# Patient Record
Sex: Female | Born: 1985 | Race: Black or African American | Hispanic: No | Marital: Single | Smoking: Never smoker
Health system: Southern US, Community
[De-identification: ages and names within clinical notes are randomized; demographics above are authoritative.]

## PROBLEM LIST (undated history)

## (undated) HISTORY — PX: TUBAL LIGATION: SHX77

## (undated) HISTORY — PX: DILATION AND CURETTAGE OF UTERUS: SHX78

---

## 2005-08-20 ENCOUNTER — Emergency Department (HOSPITAL_COMMUNITY): Admission: EM | Admit: 2005-08-20 | Discharge: 2005-08-20 | Payer: Self-pay | Admitting: *Deleted

## 2008-04-05 ENCOUNTER — Emergency Department (HOSPITAL_COMMUNITY): Admission: EM | Admit: 2008-04-05 | Discharge: 2008-04-06 | Payer: Self-pay | Admitting: Emergency Medicine

## 2010-02-20 ENCOUNTER — Emergency Department (HOSPITAL_BASED_OUTPATIENT_CLINIC_OR_DEPARTMENT_OTHER): Admission: EM | Admit: 2010-02-20 | Discharge: 2010-02-20 | Payer: Self-pay | Admitting: Emergency Medicine

## 2010-02-20 ENCOUNTER — Ambulatory Visit: Payer: Self-pay | Admitting: Diagnostic Radiology

## 2010-10-27 ENCOUNTER — Emergency Department (HOSPITAL_BASED_OUTPATIENT_CLINIC_OR_DEPARTMENT_OTHER)
Admission: EM | Admit: 2010-10-27 | Discharge: 2010-10-27 | Payer: Self-pay | Source: Home / Self Care | Admitting: Emergency Medicine

## 2011-01-08 ENCOUNTER — Encounter (HOSPITAL_COMMUNITY)
Admission: RE | Admit: 2011-01-08 | Discharge: 2011-01-08 | Disposition: A | Discharge status: Home / Self Care | Payer: Medicaid Other | Source: Ambulatory Visit | Attending: Obstetrics and Gynecology | Admitting: Obstetrics and Gynecology

## 2011-01-08 DIAGNOSIS — Z01818 Encounter for other preprocedural examination: Secondary | ICD-10-CM | POA: Insufficient documentation

## 2011-01-08 DIAGNOSIS — Z01812 Encounter for preprocedural laboratory examination: Secondary | ICD-10-CM | POA: Insufficient documentation

## 2011-01-08 LAB — CBC
Hemoglobin: 9.6 g/dL — ABNORMAL LOW (ref 12.0–15.0)
MCV: 91.2 fL (ref 78.0–100.0)
WBC: 6.4 10*3/uL (ref 4.0–10.5)

## 2011-01-08 LAB — RPR: RPR Ser Ql: NONREACTIVE

## 2011-01-15 ENCOUNTER — Other Ambulatory Visit: Payer: Self-pay | Admitting: Obstetrics and Gynecology

## 2011-01-15 ENCOUNTER — Inpatient Hospital Stay (HOSPITAL_COMMUNITY)
Admission: RE | Admit: 2011-01-15 | Discharge: 2011-01-17 | DRG: 766 | Disposition: A | Discharge status: Home / Self Care | Payer: Medicaid Other | Source: Ambulatory Visit | Attending: Obstetrics and Gynecology | Admitting: Obstetrics and Gynecology

## 2011-01-15 DIAGNOSIS — Z302 Encounter for sterilization: Secondary | ICD-10-CM

## 2011-01-15 DIAGNOSIS — O34219 Maternal care for unspecified type scar from previous cesarean delivery: Principal | ICD-10-CM | POA: Diagnosis present

## 2011-01-16 LAB — CBC
HCT: 25 % — ABNORMAL LOW (ref 36.0–46.0)
MCH: 29.7 pg (ref 26.0–34.0)
MCV: 90.6 fL (ref 78.0–100.0)
WBC: 11.1 10*3/uL — ABNORMAL HIGH (ref 4.0–10.5)

## 2011-01-18 NOTE — Op Note (Signed)
NAMETEIGAN, MANNER           ACCOUNT NO.:  [id number]  MEDICAL RECORD NO.:  [id number]           PATIENT TYPE:  I  LOCATION:  9145                          FACILITY:  [facility]  PHYSICIAN:  Malva Limes, M.D.    DATE OF BIRTH:  05/01/1986  DATE OF PROCEDURE:  01/15/2011 DATE OF DISCHARGE:                              OPERATIVE REPORT   PREOPERATIVE DIAGNOSES: 1. Intrauterine pregnancy at term. 2. History of prior cesarean section. 3. The patient declines attempted vaginal birth after cesarean     section. 4. The patient desires repeat cesarean section and bilateral tubal     ligation.  POSTOPERATIVE DIAGNOSES: 1. Intrauterine pregnancy at term. 2. History of prior cesarean section. 3. The patient declines attempted vaginal birth after cesarean     section. 4. The patient desires repeat cesarean section and bilateral tubal     ligation.  PROCEDURES: 1. Repeat low transverse cesarean section. 2. Bilateral tubal ligation, Pomeroy procedure.  SURGEON:  Malva Limes, MD  ASSISTANT:  Ilda Mori, MD  ANESTHESIA:  Spinal.  ANTIBIOTICS:  Ancef 1 g.  DRAINS:  Foley bedside drainage.  ESTIMATED BLOOD LOSS:  900 mL.  SPECIMENS:  Portion of right and left fallopian tube sent to pathology.  COMPLICATIONS:  None.  INDICATIONS:  Ms. Shehata is a 25 year old black female, G4, P 1-0-2- 1 at term who expressed her desire for repeat cesarean section and bilateral tubal ligation.  The patient's prenatal care was uncomplicated.  The patient was advised against tubal ligation given her young age.  However, she strongly desired to have this procedure.  PROCEDURE:  The patient was taken to the operating room where she was placed in the dorsal supine position after spinal anesthetic was administered.  Once an adequate level was reached, she was prepped and draped in usual fashion for this procedure.  A Foley catheter was placed in her bladder.  A Pfannenstiel incision was  made through the previous scar on entering the abdominal cavity.  The bladder flap was taken down with sharp dissection.  A low-transverse uterine incision was made in the midline and extended laterally with blunt dissection.  The infant was delivered in a vertex presentation with the vacuum extractor.  On delivery of the head, the oropharynx and nostrils were bulb suctioned. The remaining infant was then delivered.  The cord was doubly clamped and cut and the infant handed to the awaiting NICU team.  Placenta was manually removed.  The uterus was exteriorized.  Uterine cavity was wiped with a wet lap and examined.  The uterine incision was closed in a single layer of 0 Monocryl suture in a running locking fashion.  The right fallopian tube was then grasped in the isthmic portion with a Babcock.  A 2-3 cm knuckle was doubly ligated with plain gut suture. The knuckle was excised.  Both ostia were visualized.  A similar procedure was performed on the opposite side.  The uterus was then placed back in the abdominal cavity.  Hemostasis was checked and felt to be adequate.  The tubal ligation sites were hemostatic.  The patient had her parietal peritoneum and rectus muscles reapproximated  in the midline using 2-0 Monocryl in a running fashion.  The fascia was closed using 0 Monocryl suture in a running fashion.  The subcuticular tissue was made hemostatic with Bovie.  Stainless steel clips were used to close the skin.  The patient delivered one live viable black female infant weighing 7 pounds 8 ounces.  Apgars were 8 at 1 minute and 9 at 5 minutes.          ______________________________ Malva Limes, M.D.     MA/MEDQ  D:  01/15/2011  T:  01/16/2011  Job:  [id number]  Electronically Signed by Malva Limes M.D. on 01/17/2011 09:47:42 AM

## 2011-01-30 LAB — URINALYSIS, ROUTINE W REFLEX MICROSCOPIC
Bilirubin Urine: NEGATIVE
Glucose, UA: NEGATIVE mg/dL
Ketones, ur: NEGATIVE mg/dL
Nitrite: POSITIVE — AB
Protein, ur: NEGATIVE mg/dL
Urobilinogen, UA: 1 mg/dL (ref 0.0–1.0)

## 2011-01-30 LAB — URINE CULTURE: Colony Count: >=100000

## 2011-01-30 LAB — WET PREP, GENITAL: Trich, Wet Prep: NONE SEEN

## 2011-01-30 LAB — URINE MICROSCOPIC-ADD ON

## 2011-01-30 LAB — GC/CHLAMYDIA PROBE AMP, GENITAL: GC Probe Amp, Genital: NEGATIVE

## 2011-02-01 NOTE — Discharge Summary (Signed)
  NAMESHAQUILLA, Deanna Vasquez           ACCOUNT NO.:  [id number]  MEDICAL RECORD NO.:  [id number]           PATIENT TYPE:  I  LOCATION:  9145                          [facility]ACILITY:  [facility]  PHYSICIAN:  Malva Limes, M.D.    DATE OF BIRTH:  05/25/1986  DATE OF ADMISSION:  01/15/2011 DATE OF DISCHARGE:  01/17/2011                              DISCHARGE SUMMARY   PRINCIPAL DISCHARGE DIAGNOSES: 1. Intrauterine pregnancy at term. 2. History of prior cesarean section. 3. Patient desires repeat cesarean section. 4. Patient desires permanent sterilization.  PRINCIPAL PROCEDURE:  Repeat cesarean section with bilateral tubal ligation.  HISTORY OF PRESENT ILLNESS:  [name] is a 25 year old black female G68, P1-0-2-1 who presented to [facility] on January 15, 2011, for repeat C-section and bilateral tubal ligation.  The patient's prenatal care was uncomplicated.  The patient underwent repeat cesarean section with bilateral tubal ligation on January 15, 2011.  A complete description of this procedure can be found in the dictated operative note.  The patient's postop course was benign.  She was discharged to home on postop day #2.  Her preop hemoglobin was 9.  Her postop hemoglobin was 8.2.  At the time of discharge, her incision appeared to be healing well.  She was eating a regular diet and had adequate pain control and she was discharged to home on iron and Percocet.          ______________________________ Malva Limes, M.D.     MA/MEDQ  D:  01/17/2011  T:  01/17/2011  Job:  [id number]  Electronically Signed by Malva Limes M.D. on 02/01/2011 05:31:00 PM

## 2011-04-07 ENCOUNTER — Emergency Department (HOSPITAL_COMMUNITY)
Admission: EM | Admit: 2011-04-07 | Discharge: 2011-04-07 | Disposition: A | Discharge status: Home / Self Care | Payer: Medicaid Other | Attending: Emergency Medicine | Admitting: Emergency Medicine

## 2011-04-07 DIAGNOSIS — A499 Bacterial infection, unspecified: Secondary | ICD-10-CM | POA: Insufficient documentation

## 2011-04-07 DIAGNOSIS — D6489 Other specified anemias: Secondary | ICD-10-CM | POA: Insufficient documentation

## 2011-04-07 DIAGNOSIS — N760 Acute vaginitis: Secondary | ICD-10-CM | POA: Insufficient documentation

## 2011-04-07 DIAGNOSIS — B9689 Other specified bacterial agents as the cause of diseases classified elsewhere: Secondary | ICD-10-CM | POA: Insufficient documentation

## 2011-04-07 LAB — WET PREP, GENITAL
Trich, Wet Prep: NONE SEEN
Yeast Wet Prep HPF POC: NONE SEEN

## 2011-04-07 LAB — POCT PREGNANCY, URINE: Preg Test, Ur: NEGATIVE

## 2012-04-11 ENCOUNTER — Emergency Department (HOSPITAL_BASED_OUTPATIENT_CLINIC_OR_DEPARTMENT_OTHER)
Admission: EM | Admit: 2012-04-11 | Discharge: 2012-04-11 | Disposition: A | Discharge status: Home / Self Care | Payer: No Typology Code available for payment source | Attending: Emergency Medicine | Admitting: Emergency Medicine

## 2012-04-11 ENCOUNTER — Encounter (HOSPITAL_BASED_OUTPATIENT_CLINIC_OR_DEPARTMENT_OTHER): Payer: Self-pay | Admitting: Emergency Medicine

## 2012-04-11 DIAGNOSIS — T148XXA Other injury of unspecified body region, initial encounter: Secondary | ICD-10-CM

## 2012-04-11 DIAGNOSIS — Y93I9 Activity, other involving external motion: Secondary | ICD-10-CM | POA: Insufficient documentation

## 2012-04-11 DIAGNOSIS — Y998 Other external cause status: Secondary | ICD-10-CM | POA: Insufficient documentation

## 2012-04-11 DIAGNOSIS — IMO0002 Reserved for concepts with insufficient information to code with codable children: Secondary | ICD-10-CM | POA: Insufficient documentation

## 2012-04-11 DIAGNOSIS — Y9289 Other specified places as the place of occurrence of the external cause: Secondary | ICD-10-CM | POA: Insufficient documentation

## 2012-04-11 MED ORDER — IBUPROFEN 400 MG PO TABS
400.0000 mg | ORAL_TABLET | Freq: Once | ORAL | Status: AC
  Administered 2012-04-11: 400 mg via ORAL
  Filled 2012-04-11: qty 1

## 2012-04-11 MED ORDER — CYCLOBENZAPRINE HCL 10 MG PO TABS: ORAL_TABLET | ORAL | Status: DC
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
## 2012-04-11 NOTE — ED Provider Notes (Signed)
Medical screening examination/treatment/procedure(s) were performed by non-physician practitioner and as supervising physician I was immediately available for consultation/collaboration.   Wilford Merryfield B. Zakariyah Freimark, MD 04/11/12 1403 
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
## 2013-02-15 ENCOUNTER — Emergency Department (HOSPITAL_BASED_OUTPATIENT_CLINIC_OR_DEPARTMENT_OTHER)
Admission: EM | Admit: 2013-02-15 | Discharge: 2013-02-15 | Disposition: A | Discharge status: Home / Self Care | Payer: Medicaid Other | Attending: Emergency Medicine | Admitting: Emergency Medicine

## 2013-02-15 ENCOUNTER — Emergency Department (HOSPITAL_BASED_OUTPATIENT_CLINIC_OR_DEPARTMENT_OTHER): Payer: Medicaid Other

## 2013-02-15 ENCOUNTER — Encounter (HOSPITAL_BASED_OUTPATIENT_CLINIC_OR_DEPARTMENT_OTHER): Payer: Self-pay | Admitting: *Deleted

## 2013-02-15 DIAGNOSIS — R0789 Other chest pain: Secondary | ICD-10-CM | POA: Insufficient documentation

## 2013-02-15 DIAGNOSIS — A499 Bacterial infection, unspecified: Secondary | ICD-10-CM | POA: Insufficient documentation

## 2013-02-15 DIAGNOSIS — Z3202 Encounter for pregnancy test, result negative: Secondary | ICD-10-CM | POA: Insufficient documentation

## 2013-02-15 DIAGNOSIS — N760 Acute vaginitis: Secondary | ICD-10-CM | POA: Insufficient documentation

## 2013-02-15 DIAGNOSIS — B9689 Other specified bacterial agents as the cause of diseases classified elsewhere: Secondary | ICD-10-CM | POA: Insufficient documentation

## 2013-02-15 DIAGNOSIS — N898 Other specified noninflammatory disorders of vagina: Secondary | ICD-10-CM | POA: Insufficient documentation

## 2013-02-15 LAB — PREGNANCY, URINE: Preg Test, Ur: NEGATIVE

## 2013-02-15 LAB — WET PREP, GENITAL: Trich, Wet Prep: NONE SEEN

## 2013-02-15 MED ORDER — LANSOPRAZOLE 30 MG PO CPDR: 30.0000 mg | DELAYED_RELEASE_CAPSULE | Freq: Every day | ORAL | Status: DC

## 2013-02-15 MED ORDER — METRONIDAZOLE 500 MG PO TABS: 500.0000 mg | ORAL_TABLET | Freq: Two times a day (BID) | ORAL | Status: DC

## 2013-02-15 NOTE — ED Notes (Signed)
Cough for several days pt states she feels like she also has a vaginal bacterial infection

## 2013-02-15 NOTE — ED Provider Notes (Signed)
History     CSN: [id number]  Arrival date & time 02/15/13  0935   First MD Initiated Contact with Patient 02/15/13 [phone]      Chief Complaint  Patient presents with  . Cough    (Consider location/radiation/quality/duration/timing/severity/associated sxs/prior treatment) Patient is a 27 y.o. female presenting with cough. The history is provided by the patient.  Cough Associated symptoms: no chest pain, no headaches, no rash and no shortness of breath    patient had a cough for the last 2 weeks. She states is worse in the morning. She states it goes to the day. She states sometimes feels as if there is something in her throat. No fevers. No chest pain. She states it is a little pressure that goes along with it. No weight loss. She does not smoke. No clear sick contacts. No known heart problems. No difficulty doing her normal physical activity. No weight loss. Patient also states she's had some vaginal discharge. Began a couple days ago. States it feels like when she's had a previous bacterial infections. No dysuria. No pain. She hopes she is not pregnant and has had a bilateral tubal ligation.  History reviewed. No pertinent past medical history.  Past Surgical History  Procedure Laterality Date  . Dilation and curettage of uterus    . Cesarean section      History reviewed. No pertinent family history.  History  Substance Use Topics  . Smoking status: Never Smoker   . Smokeless tobacco: Never Used  . Alcohol Use: Yes     Comment: occasional    OB History   Grav Para Term Preterm Abortions TAB SAB Ect Mult Living                  Review of Systems  Constitutional: Negative for activity change and appetite change.  HENT: Negative for neck stiffness.   Eyes: Negative for pain.  Respiratory: Positive for cough and chest tightness. Negative for shortness of breath.   Cardiovascular: Negative for chest pain and leg swelling.  Gastrointestinal: Negative for nausea, vomiting,  abdominal pain and diarrhea.  Genitourinary: Positive for vaginal discharge. Negative for flank pain, decreased urine volume, vaginal bleeding, genital sores and pelvic pain.  Musculoskeletal: Negative for back pain.  Skin: Negative for rash.  Neurological: Negative for weakness, numbness and headaches.  Psychiatric/Behavioral: Negative for behavioral problems.    Allergies  Latex  Home Medications   Current Outpatient Rx  Name  Route  Sig  Dispense  Refill  . cyclobenzaprine (FLEXERIL) 10 MG tablet      1/2 to one tab po TID for muscle soreness   20 tablet   0   . lansoprazole (PREVACID) 30 MG capsule   Oral   Take 1 capsule (30 mg total) by mouth daily.   14 capsule   0   . metroNIDAZOLE (FLAGYL) 500 MG tablet   Oral   Take 1 tablet (500 mg total) by mouth 2 (two) times daily.   14 tablet   0     BP 112/64  Pulse 60  Temp(Src) 98.2 F (36.8 C) (Oral)  Resp 20  Ht 5\' 7"  (1.702 m)  Wt 104 lb (47.174 kg)  BMI 16.28 kg/m2  SpO2 100%  LMP 02/02/2013  Physical Exam  Nursing note and vitals reviewed. Constitutional: She is oriented to person, place, and time. She appears well-developed and well-nourished.  HENT:  Head: Normocephalic and atraumatic.  Eyes: EOM are normal. Pupils are equal, round, and  reactive to light.  Neck: Normal range of motion. Neck supple.  Cardiovascular: Normal rate, regular rhythm and normal heart sounds.   No murmur heard. Pulmonary/Chest: Effort normal and breath sounds normal. No respiratory distress. She has no wheezes. She has no rales.  Abdominal: Soft. Bowel sounds are normal. She exhibits no distension. There is no tenderness. There is no rebound and no guarding.  Musculoskeletal: Normal range of motion.  Neurological: She is alert and oriented to person, place, and time. No cranial nerve deficit.  Skin: Skin is warm and dry.  Psychiatric: She has a normal mood and affect. Her speech is normal.   copious thin white vaginal  discharge. Mild pain with cervical motion. No adnexal tenderness.  ED Course  Procedures (including critical care time)  Labs Reviewed  WET PREP, GENITAL - Abnormal; Notable for the following:    Clue Cells Wet Prep HPF POC MANY (*)    WBC, Wet Prep HPF POC FEW (*)    All other components within normal limits  GC/CHLAMYDIA PROBE AMP  PREGNANCY, URINE   Dg Chest 2 View  02/15/2013  *RADIOLOGY REPORT*  Clinical Data: Cough for 2 weeks.  Chest pressure and shortness of breath.  CHEST - 2 VIEW  Comparison: None.  Findings: Midline trachea.  Normal heart size and mediastinal contours. No pleural effusion or pneumothorax.  Clear lungs.  IMPRESSION: Normal chest.   Original Report Authenticated By: Jeronimo Greaves, M.D.      1. Bacterial vaginosis       MDM  Patient with chest pain and vaginal discharge. Pain may be related to reflux since occurs mostly in the morning. Xray reassuring. BV on wet prep. Will treat        Juliet Rude. Rubin Payor, MD 02/15/13 1546

## 2013-02-16 LAB — GC/CHLAMYDIA PROBE AMP: GC Probe RNA: NEGATIVE

## 2013-10-11 ENCOUNTER — Encounter (HOSPITAL_COMMUNITY): Payer: Self-pay | Admitting: Emergency Medicine

## 2013-10-11 ENCOUNTER — Emergency Department (HOSPITAL_COMMUNITY)
Admission: EM | Admit: 2013-10-11 | Discharge: 2013-10-11 | Disposition: A | Discharge status: Home / Self Care | Payer: Medicaid Other | Attending: Emergency Medicine | Admitting: Emergency Medicine

## 2013-10-11 DIAGNOSIS — H532 Diplopia: Secondary | ICD-10-CM | POA: Insufficient documentation

## 2013-10-11 DIAGNOSIS — Z3202 Encounter for pregnancy test, result negative: Secondary | ICD-10-CM | POA: Insufficient documentation

## 2013-10-11 DIAGNOSIS — R350 Frequency of micturition: Secondary | ICD-10-CM | POA: Insufficient documentation

## 2013-10-11 DIAGNOSIS — Z91040 Latex allergy status: Secondary | ICD-10-CM | POA: Insufficient documentation

## 2013-10-11 DIAGNOSIS — R3915 Urgency of urination: Secondary | ICD-10-CM | POA: Insufficient documentation

## 2013-10-11 DIAGNOSIS — R42 Dizziness and giddiness: Secondary | ICD-10-CM | POA: Insufficient documentation

## 2013-10-11 LAB — POCT PREGNANCY, URINE: Preg Test, Ur: NEGATIVE

## 2013-10-11 LAB — URINALYSIS, ROUTINE W REFLEX MICROSCOPIC
Bilirubin Urine: NEGATIVE
Glucose, UA: NEGATIVE mg/dL
Ketones, ur: NEGATIVE mg/dL
Nitrite: POSITIVE — AB
Urobilinogen, UA: 1 mg/dL (ref 0.0–1.0)
pH: 6 (ref 5.0–8.0)

## 2013-10-11 LAB — POCT I-STAT, CHEM 8
BUN: 13 mg/dL (ref 6–23)
Calcium, Ion: 1.19 mmol/L (ref 1.12–1.23)
Chloride: 107 mEq/L (ref 96–112)
HCT: 41 % (ref 36.0–46.0)
Sodium: 142 mEq/L (ref 135–145)
TCO2: 23 mmol/L (ref 0–100)

## 2013-10-11 LAB — URINE MICROSCOPIC-ADD ON

## 2013-10-11 MED ORDER — MECLIZINE HCL 50 MG PO TABS: 50.0000 mg | ORAL_TABLET | Freq: Three times a day (TID) | ORAL | Status: DC | PRN

## 2013-10-11 MED ORDER — MECLIZINE HCL 25 MG PO TABS
25.0000 mg | ORAL_TABLET | Freq: Once | ORAL | Status: AC
  Administered 2013-10-11: 25 mg via ORAL
  Filled 2013-10-11: qty 1

## 2013-10-11 MED ORDER — SODIUM CHLORIDE 0.9 % IV BOLUS (SEPSIS)
1000.0000 mL | Freq: Once | INTRAVENOUS | Status: AC
  Administered 2013-10-11: 1000 mL via INTRAVENOUS

## 2013-10-11 NOTE — ED Provider Notes (Signed)
CSN: [id number]     Arrival date & time 10/11/13  1202 History   First MD Initiated Contact with Patient 10/11/13 1358     Chief Complaint  Patient presents with  . Dizziness   (Consider location/radiation/quality/duration/timing/severity/associated sxs/prior Treatment) HPI Patient reports she has been lightheaded and dizzy x 3 days.  Room spinning is occasional, lightheadedness is persistent.  Improved with lying down, worse with moving head.  Today at work she felt her vision change and temporarily had double vision, improved with blinking and rubbing her eyes.  Felt she was going to pass out so she came to the ED.  States she had urinary frequency and urgency last week and thinks she probably had a UTI but was never treated.  Denies fevers, myalgias, CP, palpitations, SOB, URI symptoms, abdominal pain, N/V/D, change in bowel habits, abnormal vaginal discharge or menstrual irregularities.  Denies weakness or numbness of the extremities, difficulty speaking or thinking or walking.    History reviewed. No pertinent past medical history. Past Surgical History  Procedure Laterality Date  . Dilation and curettage of uterus    . Cesarean section    . Tubal ligation     Family History  Problem Relation Age of Onset  . Peptic Ulcer Disease Mother   . Bronchitis Father   . Asthma Brother    History  Substance Use Topics  . Smoking status: Never Smoker   . Smokeless tobacco: Never Used  . Alcohol Use: Yes     Comment: occasional   OB History   Grav Para Term Preterm Abortions TAB SAB Ect Mult Living                 Review of Systems  Constitutional: Negative for fever, activity change and appetite change.  HENT: Negative for congestion, ear pain, sore throat and trouble swallowing.   Respiratory: Negative for cough and shortness of breath.   Cardiovascular: Negative for chest pain.  Gastrointestinal: Negative for nausea, vomiting, abdominal pain and diarrhea.  Genitourinary:  Negative for dysuria, urgency, frequency, vaginal bleeding and vaginal discharge.  Musculoskeletal: Negative for gait problem, neck pain and neck stiffness.  Allergic/Immunologic: Negative for immunocompromised state.  Neurological: Positive for dizziness, light-headedness and headaches (this morning, resolved with motrin). Negative for syncope, speech difficulty, weakness and numbness.  Hematological: Does not bruise/bleed easily.  Psychiatric/Behavioral: Negative for confusion.    Allergies  Latex  Home Medications   Current Outpatient Rx  Name  Route  Sig  Dispense  Refill  . ibuprofen (ADVIL,MOTRIN) 200 MG tablet   Oral   Take 400 mg by mouth every 6 (six) hours as needed (pain).          BP 123/89  Pulse 78  Temp(Src) 98.2 F (36.8 C) (Oral)  Resp 16  SpO2 99%  LMP 10/11/2013 Physical Exam  Nursing note and vitals reviewed. Constitutional: She appears well-developed and well-nourished. No distress.  HENT:  Head: Normocephalic and atraumatic.  Neck: Neck supple.  Cardiovascular: Normal rate and regular rhythm.   Pulmonary/Chest: Effort normal and breath sounds normal. No respiratory distress. She has no wheezes. She has no rales.  Abdominal: Soft. She exhibits no distension. There is no tenderness. There is no rebound and no guarding.  Neurological: She is alert.  CN II-XII intact, EOMs intact, no pronator drift, grip strengths equal bilaterally; strength 5/5 in all extremities, sensation intact in all extremities; finger to nose, heel to shin, rapid alternating movements normal; gait is normal.   Dizziness  elicited with quick movements of the head.   Skin: She is not diaphoretic.    ED Course  Procedures (including critical care time) Labs Review Labs Reviewed  URINALYSIS, ROUTINE W REFLEX MICROSCOPIC  POCT I-STAT, CHEM 8  POCT PREGNANCY, URINE   Imaging Review No results found.  EKG Interpretation   None       MDM   1. Lightheadedness   2.  Dizziness     Patient with dizziness and lightheadedness x 2 days with "double vision" improved with blinking today, doubt true diplopia, neurological exam is normal.  Urinary frequency and urgency last week. Dizziness relieved with meclizine.  IVF given for lightheadedness.  UA pending at change of shift.  Discussed pt with Ebbie Ridge, PA-C, who assumes care of patient pending UA.      Trixie Dredge, PA-C 10/11/13 1630

## 2013-10-11 NOTE — ED Notes (Addendum)
Patient reports waking up with dizziness 2 days ago and today has a headache as well today. Patient denies any N/V or any other symptoms. Patient states she took a Motrin at 0700 today, but the"Motrin made her headache and dizziness worse."

## 2013-10-11 NOTE — Progress Notes (Signed)
Patient confirms she does not have a pcp.  EDCM instructed patient to call th ephone number on the back of her insurance card to help her find a pcp who is close to her and within network.  Patient verbalized understanding.

## 2013-10-13 LAB — URINE CULTURE

## 2013-10-13 NOTE — ED Provider Notes (Signed)
Medical screening examination/treatment/procedure(s) were conducted as a shared visit with non-physician practitioner(s) and myself.  I personally evaluated the patient during the encounter.  EKG Interpretation   None      No neurological deficits. Vital signs stable.  Donnetta Hutching, MD 10/13/13 0005

## 2013-10-14 NOTE — Progress Notes (Signed)
ED Antimicrobial Stewardship Positive Culture Follow Up   Deanna Vasquez is an 27 y.o. female who presented to [facility] on 10/11/2013 with a chief complaint of  Chief Complaint  Patient presents with  . Dizziness    Recent Results (from the past 720 hour(s))  URINE CULTURE     Status: None   Collection Time    10/11/13  3:39 PM      Result Value Range Status   Specimen Description URINE, CLEAN CATCH   Final   Special Requests NONE   Final   Culture  Setup Time     Final   Value: 10/11/2013 21:35     Performed at [facility] Count     Final   Value: >=100,000 COLONIES/ML     Performed at [facility]   Culture     Final   Value: ESCHERICHIA COLI     Performed at [facility]   Report Status 10/13/2013 FINAL   Final   Organism ID, Bacteria ESCHERICHIA COLI   Final     []  Patient discharged originally without antimicrobial agent and treatment is now indicated  New antibiotic prescription: Bactrim DS 1 tablet BID x 3 days  ED Provider: Johnnette Gourd, PA-C   Mickeal Skinner 10/14/2013, 9:54 AM Infectious Diseases Pharmacist Phone# [phone]

## 2013-10-14 NOTE — ED Notes (Signed)
Post ED Visit - Positive Culture Follow-up: Successful Patient Follow-Up  Culture assessed and recommendations reviewed by: []  Wes Dulaney, Pharm.D., BCPS [x]  Celedonio Miyamoto, Pharm.D., BCPS []  Georgina Pillion, Pharm.D., BCPS []  [place], [address].D., BCPS, AAHIVP []  Estella Husk, Pharm.D., BCPS, AAHIVP  [ ]  Patient discharged originally without antimicrobial agent and treatment is now indicated  New antibiotic prescription: Bactrim DS 1 tablet BID x 3 days  ED Provider: Johnnette Gourd, PA-C    Larena Sox 10/14/2013, 10:45 PM

## 2013-10-15 ENCOUNTER — Telehealth (HOSPITAL_COMMUNITY): Payer: Self-pay | Admitting: *Deleted

## 2014-04-27 ENCOUNTER — Encounter (HOSPITAL_BASED_OUTPATIENT_CLINIC_OR_DEPARTMENT_OTHER): Payer: Self-pay | Admitting: Emergency Medicine

## 2014-04-27 ENCOUNTER — Emergency Department (HOSPITAL_BASED_OUTPATIENT_CLINIC_OR_DEPARTMENT_OTHER)
Admission: EM | Admit: 2014-04-27 | Discharge: 2014-04-27 | Disposition: A | Discharge status: Home / Self Care | Payer: Medicaid Other | Attending: Emergency Medicine | Admitting: Emergency Medicine

## 2014-04-27 ENCOUNTER — Emergency Department (HOSPITAL_BASED_OUTPATIENT_CLINIC_OR_DEPARTMENT_OTHER): Payer: Medicaid Other

## 2014-04-27 DIAGNOSIS — Z79899 Other long term (current) drug therapy: Secondary | ICD-10-CM | POA: Insufficient documentation

## 2014-04-27 DIAGNOSIS — M65839 Other synovitis and tenosynovitis, unspecified forearm: Secondary | ICD-10-CM | POA: Insufficient documentation

## 2014-04-27 DIAGNOSIS — Z91040 Latex allergy status: Secondary | ICD-10-CM | POA: Insufficient documentation

## 2014-04-27 DIAGNOSIS — M65849 Other synovitis and tenosynovitis, unspecified hand: Principal | ICD-10-CM

## 2014-04-27 DIAGNOSIS — M779 Enthesopathy, unspecified: Secondary | ICD-10-CM

## 2014-04-27 NOTE — ED Provider Notes (Signed)
CSN: [id number]     Arrival date & time 04/27/14  1319 History   First MD Initiated Contact with Patient 04/27/14 1322     Chief Complaint  Patient presents with  . Hand Pain     (Consider location/radiation/quality/duration/timing/severity/associated sxs/prior Treatment) HPI Comments: Patient presents to the ER for evaluation of right hand pain. Patient reports pain in the palm of her hand that she first noticed when she woke up. Pain is in the area of the pinky finger, over the palm. She has not noticed any skin changes. Pain is constant, throbbing and worsens when she moves the finger and hand.  Patient is a 28 y.o. female presenting with hand pain.  Hand Pain    History reviewed. No pertinent past medical history. Past Surgical History  Procedure Laterality Date  . Dilation and curettage of uterus    . Cesarean section    . Tubal ligation     Family History  Problem Relation Age of Onset  . Peptic Ulcer Disease Mother   . Bronchitis Father   . Asthma Brother    History  Substance Use Topics  . Smoking status: Never Smoker   . Smokeless tobacco: Never Used  . Alcohol Use: Yes     Comment: occasional   OB History   Grav Para Term Preterm Abortions TAB SAB Ect Mult Living                 Review of Systems  Musculoskeletal: Negative for joint swelling.  Skin: Negative for color change, rash and wound.      Allergies  Latex  Home Medications   Prior to Admission medications   Medication Sig Start Date End Date Taking? Authorizing Provider  Multiple Vitamin (MULTIVITAMIN) tablet Take 1 tablet by mouth daily.   Yes Historical Provider, MD  ibuprofen (ADVIL,MOTRIN) 200 MG tablet Take 400 mg by mouth every 6 (six) hours as needed (pain).    Historical Provider, MD  meclizine (ANTIVERT) 50 MG tablet Take 1 tablet (50 mg total) by mouth 3 (three) times daily as needed for dizziness. 10/11/13   Trixie DredgeEmily West, PA-C   BP 109/68  Pulse 83  Temp(Src) 98.3 F (36.8 C)  (Oral)  Resp 18  Ht 5\' 7"  (1.702 m)  Wt 100 lb (45.36 kg)  BMI 15.66 kg/m2  SpO2 100%  LMP 04/08/2014 Physical Exam  Musculoskeletal:       Right hand: She exhibits tenderness (soft tissue, hypothenar eminence). She exhibits normal range of motion, no bony tenderness, normal capillary refill, no deformity, no laceration and no swelling.       Hands: Neurological: She is alert. She has normal strength. No cranial nerve deficit or sensory deficit. She exhibits normal muscle tone.  Skin: Skin is warm, dry and intact.    ED Course  Procedures (including critical care time) Labs Review Labs Reviewed - No data to display  Imaging Review Dg Hand Complete Right  04/27/2014   CLINICAL DATA:  Hand pain  EXAM: RIGHT HAND - COMPLETE 3+ VIEW  COMPARISON:  None.  FINDINGS: There is no evidence of fracture or dislocation. There is no evidence of arthropathy or other focal bone abnormality. Soft tissues are unremarkable.  IMPRESSION: Negative.   Electronically Signed   By: Marlan Palauharles  Clark M.D.   On: 04/27/2014 14:11     EKG Interpretation None      MDM   Final diagnoses:  None    Presents with nontraumatic pain over the hyperthenar eminence  region of the palm of the right hand. No overlying skin changes - no redness, induration, warmth. Nothing to suggest infection. Patient has normal sensation over the lesion and distally in the fingers. She has normal capillary refill. Normal neurovascular function of the hand is identified.  Palpation of the soft tissues on the ulnar aspect of the palm elicits tenderness. Palpation over the metacarpal bones does not. Pain appears to be soft tissue in nature, likely muscular and inflammatory.    Gilda Creasehristopher J. Andrika Peraza, MD 04/27/14 1438

## 2014-04-27 NOTE — Discharge Instructions (Signed)
Repetitive Strain Injuries  Repetitive strain injuries (RSIs) result from overuse or misuse of soft tissues including muscles, tendons, or nerves. Tendons are the cord-like structures that attach muscles to bones. RSIs can affect almost any part of the body. However, RSIs are most common in the arms (thumbs, wrists, elbows, shoulders) and legs (ankles, knees). Common medical conditions that are often caused by repetitive strain include carpal tunnel syndrome, tennis or golfer's elbow, bursitis, and tendonitis. If RSIs are treated early, and therepeated activity is reduced or removed, the severity and length of your problems can usually be reduced. RSIs are also called cumulative trauma disorders (CTD).   CAUSES   Many RSIs occur due to repeating the same activity at work over weeks or months without sufficient rest, such as prolonged typing. RSIs also commonly occur when a hobby or sport is done repeatedly without sufficient rest. RSIs can also occur due to repeated strain or stress on a body part in someone who has one or more risk factors for RSIs.  RISK FACTORS  Workplace risk factors   Frequent computer use, especially if your workstation is not adjusted for your body type.   Infrequent rest breaks.   Working in a high-pressure environment.   Working at a fast pace.   Repeating the same motion, such as frequent typing.   Working in an awkward position or holding the same position for a long time.   Forceful movements such as lifting, pulling, or pushing.   Vibration caused by using power tools.   Working in cold temperatures.   Job stress.  Personal risk factors   Poor posture.   Being loose-jointed.   Not exercising regularly.   Being overweight.   Arthritis, diabetes, thyroid problems, or other long-term (chronic)medical conditions.   Vitamin deficiencies.   Keeping your fingernails long.   An unhealthy, stressful, or inactive lifestyle.   Not sleeping well.  SYMPTOMS   Symptoms often  begin at work but become more noticeable after the repeated stress has ended. For example, you may develop fatigue or soreness in your wrist while typingat work, and at night you may develop numbness and tingling in your fingers. Common symptoms include:    Burning, shooting, or aching pain, especially in the fingers, palms, wrists, forearms, or shoulders.   Tenderness.   Swelling.   Tingling, numbness, or loss of feeling.   Pain with certain activities, such as turning a doorknob or reaching above your head.   Weakness, heaviness, or loss of coordination in yourhand.   Muscle spasms or tightness.  In some cases, symptoms can become so intense that it is difficult to perform everyday tasks. Symptoms that do not improve with rest may indicate a more serious condition.   DIAGNOSIS   Your caregiver may determine the type ofRSI you have based on your medical evaluation and a description of your activities.   TREATMENT   Treatment depends on the severity and type of RSI you have. Your caregiver may recommend rest for the affected body part, medicines, and physical or occupational therapy to reduce pain, swelling, and soreness. Discuss the activities you do repeatedly with your caregiver. Your caregiver can help you decide whether you need to change your activities. An RSI may take months or years to heal, especially if the affected body part gets insufficient rest. In some cases, such as severe carpal tunnel syndrome, surgery may be recommended.  PREVENTION   Talk with your supervisor to make sure you have the proper equipment   cushion in the curve of your lower back.  Shoulders and arms relaxed and at your sides.  Neck relaxed and not bent forwards or backwards.  Your desk and computer workstation  properly adjusted to your body type.  Your chair adjusted so there is no excess pressure on the back of your thighs.  The keyboard resting above your thighs. You should be able to reach the keys with your elbows at your side, bent at a right angle. Your arms should be supported on forearm rests, with your forearms parallel to the ground.  The computer mouse within easy reach.  The monitor directly in front of you, so that your eyes are aligned with the top of the screen. The screen should be about 15 to 25 inches from your eyes.  While typing, keep your wrist straight, in a neutral position. Move your entire arm when you move your mouse or when typing hard-to-reach keys.  Only use your computer as much as you need to for work. Do not use it during breaks.  Take breaks often from any repeated activity. Alternate with another task which requires you to use different muscles, or rest at least once every hour.  Change positions regularly. If you spend a lot of time sitting, get up, walk around, and stretch.  Do not hold pens or pencils tightly when writing.  Exercise regularly.  Maintain a normal weight.  Eat a diet with plenty of vegetables, whole grains, and fruit.  Get sufficient, restful sleep. HOME CARE INSTRUCTIONS  If your caregiver prescribed medicine to help reduce swelling, take it as directed.  Only take over-the-counter or prescription medicines for pain, discomfort, or fever as directed by your caregiver.  Reduce, and if needed, stopthe activities that are causing your problems until you have no further symptoms.If your symptoms are work-related, you may need to talk to your supervisor about changing your activities.  When symptoms develop, put ice or a cold pack on the aching area.  Put ice in a plastic bag.  Place a towel between your skin and the bag.  Leave the ice on for 15-20 minutes.  If you were given a splint to keep your wrist from bending, wear it as  instructed. It is important to wear the splint at night. Use the splint for as long as your caregiver recommends. SEEK MEDICAL CARE IF:  You develop new problems.  Your problems do not get better with medicine. MAKE SURE YOU:  Understand these instructions.  Will watch your condition.  Will get help right away if you are not doing well or get worse. Document Released: 10/18/2002 Document Revised: 04/28/2012 Document Reviewed: 12/19/2011 [facility] Patient Information 2015 [place], [place]. This information is not intended to replace advice given to you by your health care provider. Make sure you discuss any questions you have with your health care provider.  Tendinitis Tendinitis is swelling and inflammation of the tendons. Tendons are band-like tissues that connect muscle to bone. Tendinitis commonly occurs in the:   Shoulders (rotator cuff).  Heels (Achilles tendon).  Elbows (triceps tendon). CAUSES Tendinitis is usually caused by overusing the tendon, muscles, and joints involved. When the tissue surrounding a tendon (synovium) becomes inflamed, it is called tenosynovitis. Tendinitis commonly develops in people whose jobs require repetitive motions. SYMPTOMS  Pain.  Tenderness.  Mild swelling. DIAGNOSIS Tendinitis is usually diagnosed by physical exam. Your health care provider may also order X-rays or other imaging tests. TREATMENT Your health care provider may recommend certain  medicines or exercises for your treatment. HOME CARE INSTRUCTIONS   Use a sling or splint for as long as directed by your health care provider until the pain decreases.  Put ice on the injured area.  Put ice in a plastic bag.  Place a towel between your skin and the bag.  Leave the ice on for 15-20 minutes, 3-4 times a day, or as directed by your health care provider.  Avoid using the limb while the tendon is painful. Perform gentle range of motion exercises only as directed by your health  care provider. Stop exercises if pain or discomfort increase, unless directed otherwise by your health care provider.  Only take over-the-counter or prescription medicines for pain, discomfort, or fever as directed by your health care provider. SEEK MEDICAL CARE IF:   Your pain and swelling increase.  You develop new, unexplained symptoms, especially increased numbness in the hands. MAKE SURE YOU:   Understand these instructions.  Will watch your condition.  Will get help right away if you are not doing well or get worse. Document Released: 10/25/2000 Document Revised: 11/02/2013 Document Reviewed: 01/14/2011 [facility] Patient Information 2015 [place]. This information is not intended to replace advice given to you by your health care provider. Make sure you discuss any questions you have with your health care provider.

## 2014-04-27 NOTE — ED Notes (Signed)
Pt c/o right hand pain at base of 5th finger, non radiating. Pain worse when "picking up something". Denies injury

## 2014-11-05 ENCOUNTER — Emergency Department (HOSPITAL_BASED_OUTPATIENT_CLINIC_OR_DEPARTMENT_OTHER)
Admission: EM | Admit: 2014-11-05 | Discharge: 2014-11-05 | Disposition: A | Discharge status: Home / Self Care | Payer: No Typology Code available for payment source | Attending: Emergency Medicine | Admitting: Emergency Medicine

## 2014-11-05 ENCOUNTER — Encounter (HOSPITAL_BASED_OUTPATIENT_CLINIC_OR_DEPARTMENT_OTHER): Payer: Self-pay | Admitting: *Deleted

## 2014-11-05 DIAGNOSIS — Z91040 Latex allergy status: Secondary | ICD-10-CM | POA: Insufficient documentation

## 2014-11-05 DIAGNOSIS — Z79899 Other long term (current) drug therapy: Secondary | ICD-10-CM | POA: Insufficient documentation

## 2014-11-05 DIAGNOSIS — B9689 Other specified bacterial agents as the cause of diseases classified elsewhere: Secondary | ICD-10-CM

## 2014-11-05 DIAGNOSIS — Z3202 Encounter for pregnancy test, result negative: Secondary | ICD-10-CM | POA: Insufficient documentation

## 2014-11-05 DIAGNOSIS — N390 Urinary tract infection, site not specified: Secondary | ICD-10-CM | POA: Insufficient documentation

## 2014-11-05 DIAGNOSIS — N760 Acute vaginitis: Secondary | ICD-10-CM | POA: Insufficient documentation

## 2014-11-05 LAB — URINALYSIS, ROUTINE W REFLEX MICROSCOPIC
Bilirubin Urine: NEGATIVE
Glucose, UA: NEGATIVE mg/dL
HGB URINE DIPSTICK: NEGATIVE
Ketones, ur: NEGATIVE mg/dL
Nitrite: POSITIVE — AB
Protein, ur: NEGATIVE mg/dL
SPECIFIC GRAVITY, URINE: 1.016 (ref 1.005–1.030)
Urobilinogen, UA: 0.2 mg/dL (ref 0.0–1.0)
pH: 7.5 (ref 5.0–8.0)

## 2014-11-05 LAB — PREGNANCY, URINE: PREG TEST UR: NEGATIVE

## 2014-11-05 LAB — WET PREP, GENITAL
Trich, Wet Prep: NONE SEEN
YEAST WET PREP: NONE SEEN

## 2014-11-05 LAB — URINE MICROSCOPIC-ADD ON

## 2014-11-05 MED ORDER — CEPHALEXIN 500 MG PO CAPS: 500.0000 mg | ORAL_CAPSULE | Freq: Four times a day (QID) | ORAL | Status: DC

## 2014-11-05 MED ORDER — METRONIDAZOLE 500 MG PO TABS
500.0000 mg | ORAL_TABLET | Freq: Two times a day (BID) | ORAL | Status: DC
Start: 2014-11-05 — End: 2015-06-23

## 2014-11-05 NOTE — ED Notes (Signed)
Pt c/o vaginal discharge and odor x 1 week

## 2014-11-05 NOTE — Discharge Instructions (Signed)
Take the prescribed medication as directed.  Do not drink alcohol while taking Flagyl, it will make you sick. Return to the ED for new or worsening symptoms.

## 2014-11-05 NOTE — ED Provider Notes (Signed)
CSN: [id number]     Arrival date & time 11/05/14  1217 History   First MD Initiated Contact with Patient 11/05/14 1256     Chief Complaint  Patient presents with  . Vaginal Discharge     (Consider location/radiation/quality/duration/timing/severity/associated sxs/prior Treatment) Patient is a 28 y.o. female presenting with vaginal discharge. The history is provided by the patient and medical records.  Vaginal Discharge   This is a 28 year old female presenting to the ED complaining of vaginal discharge for the past week. States vaginal discharge is thick and white in appearance with a foul odor. States similar symptoms in the past when she was diagnosed with bacterial vaginosis. She denies any new sexual partners or concern for STD. No urinary symptoms. No abdominal pain or pelvic pain. No nausea, vomiting, or diarrhea. Patient is status post tubal ligation.  History reviewed. No pertinent past medical history. Past Surgical History  Procedure Laterality Date  . Dilation and curettage of uterus    . Cesarean section    . Tubal ligation     Family History  Problem Relation Age of Onset  . Peptic Ulcer Disease Mother   . Bronchitis Father   . Asthma Brother    History  Substance Use Topics  . Smoking status: Never Smoker   . Smokeless tobacco: Never Used  . Alcohol Use: Yes     Comment: occasional   OB History    No data available     Review of Systems  Genitourinary: Positive for vaginal discharge.  All other systems reviewed and are negative.     Allergies  Latex  Home Medications   Prior to Admission medications   Medication Sig Start Date End Date Taking? Authorizing Provider  ibuprofen (ADVIL,MOTRIN) 200 MG tablet Take 400 mg by mouth every 6 (six) hours as needed (pain).    Historical Provider, MD  meclizine (ANTIVERT) 50 MG tablet Take 1 tablet (50 mg total) by mouth 3 (three) times daily as needed for dizziness. 10/11/13   Trixie DredgeEmily West, PA-C  Multiple  Vitamin (MULTIVITAMIN) tablet Take 1 tablet by mouth daily.    Historical Provider, MD   BP 112/58 mmHg  Pulse 83  Temp(Src) 98.4 F (36.9 C) (Oral)  Resp 16  Ht 5\' 6"  (1.676 m)  Wt 100 lb (45.36 kg)  BMI 16.15 kg/m2  SpO2 100%  LMP 10/13/2014   Physical Exam  Constitutional: She is oriented to person, place, and time. She appears well-developed and well-nourished. No distress.  HENT:  Head: Normocephalic and atraumatic.  Mouth/Throat: Oropharynx is clear and moist.  Eyes: Conjunctivae and EOM are normal. Pupils are equal, round, and reactive to light.  Neck: Normal range of motion. Neck supple.  Cardiovascular: Normal rate, regular rhythm and normal heart sounds.   Pulmonary/Chest: Effort normal and breath sounds normal. No respiratory distress. She has no wheezes.  Abdominal: Soft. Bowel sounds are normal. There is no tenderness. There is no guarding.  Genitourinary: There is no lesion on the right labia. There is no lesion on the left labia. Cervix exhibits no motion tenderness. Right adnexum displays no tenderness. Left adnexum displays no tenderness. No erythema, tenderness or bleeding in the vagina. No foreign body around the vagina. Vaginal discharge found.  Normal female external genitalia without visible lesions; moderate amount of foul-smelling white vaginal discharge present in vault, cervix normal in appearance; no adnexal or CMT  Musculoskeletal: Normal range of motion.  Neurological: She is alert and oriented to person, place, and time.  Skin: Skin is warm and dry. She is not diaphoretic.  Psychiatric: She has a normal mood and affect.  Nursing note and vitals reviewed.   ED Course  Procedures (including critical care time) Labs Review Labs Reviewed  WET PREP, GENITAL - Abnormal; Notable for the following:    Clue Cells Wet Prep HPF POC MODERATE (*)    WBC, Wet Prep HPF POC FEW (*)    All other components within normal limits  URINALYSIS, ROUTINE W REFLEX  MICROSCOPIC - Abnormal; Notable for the following:    APPearance TURBID (*)    Nitrite POSITIVE (*)    Leukocytes, UA SMALL (*)    All other components within normal limits  URINE MICROSCOPIC-ADD ON - Abnormal; Notable for the following:    Squamous Epithelial / LPF MANY (*)    Bacteria, UA MANY (*)    All other components within normal limits  GC/CHLAMYDIA PROBE AMP  PREGNANCY, URINE    Imaging Review No results found.   EKG Interpretation None      MDM   Final diagnoses:  UTI (lower urinary tract infection)  Bacterial vaginosis   28 year old female with vaginal discharge. Abdominal exam is benign. No adnexal or cervical motion tenderness on exam to suggest PID. Wet prep confirms bacterial vaginosis. Patient also has a nitrite positive urinary tract infection. She was started on Keflex and Flagyl.  FU with PCP.  Discussed plan with patient, he/she acknowledged understanding and agreed with plan of care.  Return precautions given for new or worsening symptoms.  Garlon HatchetLisa M Noreen Mackintosh, PA-C 11/05/14 1510  Joya Gaskinsonald W Wickline, MD 11/05/14 [phone]

## 2014-11-07 LAB — GC/CHLAMYDIA PROBE AMP
CT Probe RNA: NEGATIVE
GC Probe RNA: NEGATIVE

## 2015-06-22 ENCOUNTER — Emergency Department (HOSPITAL_BASED_OUTPATIENT_CLINIC_OR_DEPARTMENT_OTHER): Payer: Medicaid Other

## 2015-06-22 ENCOUNTER — Encounter (HOSPITAL_BASED_OUTPATIENT_CLINIC_OR_DEPARTMENT_OTHER): Payer: Self-pay | Admitting: *Deleted

## 2015-06-22 DIAGNOSIS — Y9389 Activity, other specified: Secondary | ICD-10-CM | POA: Insufficient documentation

## 2015-06-22 DIAGNOSIS — Y9289 Other specified places as the place of occurrence of the external cause: Secondary | ICD-10-CM | POA: Insufficient documentation

## 2015-06-22 DIAGNOSIS — Y998 Other external cause status: Secondary | ICD-10-CM | POA: Diagnosis not present

## 2015-06-22 DIAGNOSIS — W208XXA Other cause of strike by thrown, projected or falling object, initial encounter: Secondary | ICD-10-CM | POA: Insufficient documentation

## 2015-06-22 DIAGNOSIS — Z91040 Latex allergy status: Secondary | ICD-10-CM | POA: Insufficient documentation

## 2015-06-22 DIAGNOSIS — Z79899 Other long term (current) drug therapy: Secondary | ICD-10-CM | POA: Insufficient documentation

## 2015-06-22 DIAGNOSIS — Z792 Long term (current) use of antibiotics: Secondary | ICD-10-CM | POA: Insufficient documentation

## 2015-06-22 DIAGNOSIS — S99922A Unspecified injury of left foot, initial encounter: Secondary | ICD-10-CM | POA: Diagnosis present

## 2015-06-22 DIAGNOSIS — S97122A Crushing injury of left lesser toe(s), initial encounter: Secondary | ICD-10-CM | POA: Insufficient documentation

## 2015-06-22 NOTE — ED Notes (Signed)
Injury to her left 3rd toe. She dropped a 6 pack of Gatorade on her foot earlier today. Bruising and swelling noted.

## 2015-06-23 ENCOUNTER — Emergency Department (HOSPITAL_BASED_OUTPATIENT_CLINIC_OR_DEPARTMENT_OTHER)
Admission: EM | Admit: 2015-06-23 | Discharge: 2015-06-23 | Disposition: A | Discharge status: Home / Self Care | Payer: Medicaid Other | Attending: Emergency Medicine | Admitting: Emergency Medicine

## 2015-06-23 DIAGNOSIS — S97102A Crushing injury of unspecified left toe(s), initial encounter: Secondary | ICD-10-CM

## 2015-06-23 MED ORDER — HYDROCODONE-ACETAMINOPHEN 5-325 MG PO TABS: 1.0000 | ORAL_TABLET | Freq: Four times a day (QID) | ORAL | Status: AC | PRN

## 2015-06-23 MED ORDER — HYDROCODONE-ACETAMINOPHEN 5-325 MG PO TABS
1.0000 | ORAL_TABLET | Freq: Once | ORAL | Status: AC
  Administered 2015-06-23: 1 via ORAL
  Filled 2015-06-23: qty 1

## 2015-06-23 NOTE — ED Notes (Signed)
Ice applied

## 2015-06-23 NOTE — ED Provider Notes (Signed)
CSN: [id number]     Arrival date & time 06/22/15  2153 History   First MD Initiated Contact with Patient 06/23/15 0151     Chief Complaint  Patient presents with  . Toe Injury     (Consider location/radiation/quality/duration/timing/severity/associated sxs/prior Treatment) HPI  This is a 29 year old female who dropped a sixpack of Gatorade onto her left middle toe about one or 2 PM yesterday afternoon. She is having moderate pain in that toe, worse with ambulation, palpation or attempted movement. Range of motion of that toe is decreased. There is associated ecchymosis but no deformity.  History reviewed. No pertinent past medical history. Past Surgical History  Procedure Laterality Date  . Dilation and curettage of uterus    . Cesarean section    . Tubal ligation     Family History  Problem Relation Age of Onset  . Peptic Ulcer Disease Mother   . Bronchitis Father   . Asthma Brother    Social History  Substance Use Topics  . Smoking status: Never Smoker   . Smokeless tobacco: Never Used  . Alcohol Use: Yes     Comment: occasional   OB History    No data available     Review of Systems  All other systems reviewed and are negative.   Allergies  Latex  Home Medications   Prior to Admission medications   Medication Sig Start Date End Date Taking? Authorizing Provider  cephALEXin (KEFLEX) 500 MG capsule Take 1 capsule (500 mg total) by mouth 4 (four) times daily. 11/05/14   Garlon Hatchet, PA-C  ibuprofen (ADVIL,MOTRIN) 200 MG tablet Take 400 mg by mouth every 6 (six) hours as needed (pain).    Historical Provider, MD  meclizine (ANTIVERT) 50 MG tablet Take 1 tablet (50 mg total) by mouth 3 (three) times daily as needed for dizziness. 10/11/13   Trixie Dredge, PA-C  metroNIDAZOLE (FLAGYL) 500 MG tablet Take 1 tablet (500 mg total) by mouth 2 (two) times daily. 11/05/14   Garlon Hatchet, PA-C  Multiple Vitamin (MULTIVITAMIN) tablet Take 1 tablet by mouth daily.     Historical Provider, MD   BP 113/70 mmHg  Pulse 76  Temp(Src) 98.7 F (37.1 C) (Oral)  Resp 18  Ht 5\' 6"  (1.676 m)  Wt 98 lb (44.453 kg)  BMI 15.83 kg/m2  SpO2 100%  LMP 06/03/2015   Physical Exam  General: Well-developed, well-nourished female in no acute distress; appearance consistent with age of record HENT: normocephalic; atraumatic Eyes: pupils equal, round and reactive to light; extraocular muscles intact Neck: supple Heart: regular rate and rhythm Lungs: clear to auscultation bilaterally Abdomen: soft; nondistended; nontender; bowel sounds present Extremities: No deformity; tenderness and ecchymosis of left third toe with decreased range of motion, toe distally neurovascularly intact Neurologic: Awake, alert and oriented; motor function intact in all extremities and symmetric; no facial droop Skin: Warm and dry Psychiatric: Normal mood and affect    ED Course  Procedures (including critical care time)   MDM  Nursing notes and vitals signs, including pulse oximetry, reviewed.  Summary of this visit's results, reviewed by myself:  Imaging Studies: Dg Foot Complete Left  06/22/2015   CLINICAL DATA:  Blow to the left foot tonight. Redness and swelling about the third and fourth toes. Initial encounter.  EXAM: LEFT FOOT - COMPLETE 3+ VIEW  COMPARISON:  None.  FINDINGS: There is no evidence of fracture or dislocation. There is no evidence of arthropathy or other focal bone abnormality. Soft  tissues are unremarkable.  IMPRESSION: Negative exam.   Electronically Signed   By: Drusilla Kanner M.D.   On: 06/22/2015 22:57       Paula Libra, MD 06/23/15 0157

## 2015-06-23 NOTE — ED Notes (Signed)
Dropped gatorade on left middle toe,  Increased redness, bruising and swelling

## 2019-03-21 ENCOUNTER — Emergency Department (HOSPITAL_BASED_OUTPATIENT_CLINIC_OR_DEPARTMENT_OTHER)
Admission: EM | Admit: 2019-03-21 | Discharge: 2019-03-21 | Disposition: A | Discharge status: Home / Self Care | Payer: Managed Care, Other (non HMO) | Attending: Emergency Medicine | Admitting: Emergency Medicine

## 2019-03-21 ENCOUNTER — Encounter (HOSPITAL_BASED_OUTPATIENT_CLINIC_OR_DEPARTMENT_OTHER): Payer: Self-pay | Admitting: Emergency Medicine

## 2019-03-21 ENCOUNTER — Other Ambulatory Visit: Payer: Self-pay

## 2019-03-21 ENCOUNTER — Emergency Department (HOSPITAL_BASED_OUTPATIENT_CLINIC_OR_DEPARTMENT_OTHER): Payer: Managed Care, Other (non HMO)

## 2019-03-21 DIAGNOSIS — R079 Chest pain, unspecified: Secondary | ICD-10-CM | POA: Diagnosis not present

## 2019-03-21 LAB — COMPREHENSIVE METABOLIC PANEL
ALT: 12 U/L (ref 0–44)
AST: 21 U/L (ref 15–41)
Albumin: 4.1 g/dL (ref 3.5–5.0)
Alkaline Phosphatase: 27 U/L — ABNORMAL LOW (ref 38–126)
Anion gap: 8 (ref 5–15)
BUN: 19 mg/dL (ref 6–20)
CO2: 24 mmol/L (ref 22–32)
Calcium: 9.2 mg/dL (ref 8.9–10.3)
Chloride: 106 mmol/L (ref 98–111)
Creatinine, Ser: 0.78 mg/dL (ref 0.44–1.00)
GFR calc Af Amer: >60 mL/min (ref >60)
GFR calc non Af Amer: >60 mL/min (ref >60)
Glucose, Bld: 89 mg/dL (ref 70–99)
Potassium: 3.6 mmol/L (ref 3.5–5.1)
Sodium: 138 mmol/L (ref 135–145)
Total Bilirubin: 0.9 mg/dL (ref 0.3–1.2)
Total Protein: 7 g/dL (ref 6.5–8.1)

## 2019-03-21 LAB — CBC WITH DIFFERENTIAL/PLATELET
Abs Immature Granulocytes: 0.01 10*3/uL (ref 0.00–0.07)
Basophils Absolute: 0 10*3/uL (ref 0.0–0.1)
Basophils Relative: 0 %
Eosinophils Absolute: 0 10*3/uL (ref 0.0–0.5)
Eosinophils Relative: 1 %
HCT: 31.9 % — ABNORMAL LOW (ref 36.0–46.0)
Hemoglobin: 10.1 g/dL — ABNORMAL LOW (ref 12.0–15.0)
Immature Granulocytes: 0 %
Lymphocytes Relative: 42 %
Lymphs Abs: 2.4 10*3/uL (ref 0.7–4.0)
MCH: 26.6 pg (ref 26.0–34.0)
MCHC: 31.7 g/dL (ref 30.0–36.0)
MCV: 83.9 fL (ref 80.0–100.0)
Monocytes Absolute: 0.4 10*3/uL (ref 0.1–1.0)
Monocytes Relative: 7 %
Neutro Abs: 2.8 10*3/uL (ref 1.7–7.7)
Neutrophils Relative %: 50 %
Platelets: 365 10*3/uL (ref 150–400)
RBC: 3.8 MIL/uL — ABNORMAL LOW (ref 3.87–5.11)
RDW: 15.4 % (ref 11.5–15.5)
WBC: 5.6 10*3/uL (ref 4.0–10.5)
nRBC: 0 % (ref 0.0–0.2)

## 2019-03-21 LAB — TROPONIN I: Troponin I: <0.03 ng/mL (ref <0.03)

## 2019-03-21 LAB — HCG, SERUM, QUALITATIVE: Preg, Serum: NEGATIVE

## 2019-03-21 NOTE — ED Provider Notes (Signed)
[facility] EMERGENCY DEPARTMENT Provider Note   CSN: [id number] Arrival date & time: 03/21/19  2029    History   Chief Complaint Chief Complaint  Patient presents with  . Chest Pain    HPI Deanna Vasquez is a 33 y.o. female.     Patient is a 33 year old female with history of tubal ligation, but no other prior medical history.  She presents today with complaints of chest discomfort.  This is been present since Friday.  It has been occurring intermittently since Friday it has been occurring intermittently for the past 2-1/2 days.  She describes a discomfort in her chest that she felt likely was "gas".  She took an anti-gas medication which actually seemed to help.  She describes discomfort that radiates down her left arm but is not associated with any shortness of breath, nausea, diaphoresis.  She denies any exertional symptoms.  Patient has no cardiac risk factors.  The history is provided by the patient.  Chest Pain  Pain location:  Substernal area Pain quality: tightness   Pain radiates to:  L arm Pain severity:  Moderate Duration:  3 days Timing:  Intermittent Chronicity:  New Relieved by: Gas medication. Worsened by:  Nothing   History reviewed. No pertinent past medical history.  There are no active problems to display for this patient.   Past Surgical History:  Procedure Laterality Date  . CESAREAN SECTION    . DILATION AND CURETTAGE OF UTERUS    . TUBAL LIGATION       OB History   No obstetric history on file.      Home Medications    Prior to Admission medications   Medication Sig Start Date End Date Taking? Authorizing Provider  HYDROcodone-acetaminophen (NORCO) 5-325 MG per tablet Take 1-2 tablets by mouth every 6 (six) hours as needed (for pain). 06/23/15  Yes Molpus, John, MD    Family History Family History  Problem Relation Age of Onset  . Peptic Ulcer Disease Mother   . Bronchitis Father   . Asthma Brother     Social  History Social History   Tobacco Use  . Smoking status: Never Smoker  . Smokeless tobacco: Never Used  Substance Use Topics  . Alcohol use: Yes    Comment: occasional  . Drug use: No     Allergies   Latex   Review of Systems Review of Systems  Cardiovascular: Positive for chest pain.  All other systems reviewed and are negative.    Physical Exam Updated Vital Signs BP (!) 109/92   Pulse 72   Temp 98.4 F (36.9 C) (Oral)   Resp 14   Ht 5\' 6"  (1.676 m)   Wt 47.2 kg   LMP 03/19/2019 (Exact Date)   SpO2 100%   BMI 16.79 kg/m   Physical Exam Vitals signs and nursing note reviewed.  Constitutional:      General: She is not in acute distress.    Appearance: She is well-developed. She is not diaphoretic.  HENT:     Head: Normocephalic and atraumatic.  Neck:     Musculoskeletal: Normal range of motion and neck supple.  Cardiovascular:     Rate and Rhythm: Normal rate and regular rhythm.     Heart sounds: No murmur. No friction rub. No gallop.   Pulmonary:     Effort: Pulmonary effort is normal. No respiratory distress.     Breath sounds: Normal breath sounds. No wheezing.  Abdominal:  General: Bowel sounds are normal. There is no distension.     Palpations: Abdomen is soft.     Tenderness: There is no abdominal tenderness.  Musculoskeletal: Normal range of motion.     Right lower leg: She exhibits no tenderness. No edema.     Left lower leg: She exhibits no tenderness. No edema.  Skin:    General: Skin is warm and dry.  Neurological:     Mental Status: She is alert and oriented to person, place, and time.      ED Treatments / Results  Labs (all labs ordered are listed, but only abnormal results are displayed) Labs Reviewed  COMPREHENSIVE METABOLIC PANEL  TROPONIN I  CBC WITH DIFFERENTIAL/PLATELET  HCG, SERUM, QUALITATIVE    EKG EKG Interpretation  Date/Time:  Sunday Mar 21 2019 20:38:42 EDT Ventricular Rate:  68 PR Interval:    QRS  Duration: 81 QT Interval:  385 QTC Calculation: 410 R Axis:   82 Text Interpretation:  Sinus rhythm RSR' in V1 or V2, probably normal variant Confirmed by Geoffery Lyons ([id number]) on 03/21/2019 8:42:41 PM   Radiology No results found.  Procedures Procedures (including critical care time)  Medications Ordered in ED Medications - No data to display   Initial Impression / Assessment and Plan / ED Course  I have reviewed the triage vital signs and the nursing notes.  Pertinent labs & imaging results that were available during my care of the patient were reviewed by me and considered in my medical decision making (see chart for details).  Patient is a 33 year old female with no significant PMH and no cardiac risk factors.  She presents today with complaints of chest discomfort.  This is been ongoing intermittently for the past 2-1/2 days.  She describes a pressure in her chest and upper abdomen that she describes as "gas".  She has taken Gas-X with some relief.  She presents today at the urging of her husband and mother.  Patient is currently pain-free.  Work-up shows a normal EKG and negative troponin.  Her chest x-ray is clear and laboratory studies are otherwise unremarkable.  Patient has no cardiac risk factors, atypical symptoms, and negative work-up.  I believe as though she is appropriate for discharge.  I will recommend over-the-counter Prilosec and have her follow-up as needed if symptoms do not resolve.  Final Clinical Impressions(s) / ED Diagnoses   Final diagnoses:  None    ED Discharge Orders    None       Geoffery Lyons, MD 03/21/19 2158

## 2019-03-21 NOTE — ED Notes (Signed)
Patient transported to X-ray 

## 2019-03-21 NOTE — Discharge Instructions (Addendum)
Prilosec 20 mg once daily for the next 2 weeks.  Medication is available over-the-counter.  Return to the emergency department if you develop worsening pain, shortness of breath, high fever, or other new and concerning symptoms.

## 2019-03-21 NOTE — ED Triage Notes (Signed)
Patient arrived c/o central chest pain since Friday, with associated right arm numbness and tingling that is intermittent. Patient states she took anti gas medications at 1600 and pain in chest was relieved. Patient is AO x 4, VS stable.

## 2019-12-31 ENCOUNTER — Emergency Department (HOSPITAL_BASED_OUTPATIENT_CLINIC_OR_DEPARTMENT_OTHER)
Admission: EM | Admit: 2019-12-31 | Discharge: 2019-12-31 | Disposition: A | Discharge status: Home / Self Care | Payer: 59 | Attending: Emergency Medicine | Admitting: Emergency Medicine

## 2019-12-31 ENCOUNTER — Encounter (HOSPITAL_BASED_OUTPATIENT_CLINIC_OR_DEPARTMENT_OTHER): Payer: Self-pay | Admitting: *Deleted

## 2019-12-31 ENCOUNTER — Emergency Department (HOSPITAL_BASED_OUTPATIENT_CLINIC_OR_DEPARTMENT_OTHER): Payer: 59

## 2019-12-31 ENCOUNTER — Other Ambulatory Visit: Payer: Self-pay

## 2019-12-31 DIAGNOSIS — M25561 Pain in right knee: Secondary | ICD-10-CM | POA: Insufficient documentation

## 2019-12-31 DIAGNOSIS — G8929 Other chronic pain: Secondary | ICD-10-CM | POA: Insufficient documentation

## 2019-12-31 MED ORDER — PREDNISONE 20 MG PO TABS
40.0000 mg | ORAL_TABLET | Freq: Once | ORAL | Status: AC
  Administered 2019-12-31: 17:00:00 40 mg via ORAL
  Filled 2019-12-31: qty 2

## 2019-12-31 MED ORDER — PREDNISONE 10 MG (21) PO TBPK: ORAL_TABLET | ORAL | 0 refills | Status: AC

## 2019-12-31 NOTE — ED Triage Notes (Signed)
Right knee pain. She has been told she has a bakers cyst. Today her leg "locked up" while working.

## 2019-12-31 NOTE — ED Provider Notes (Signed)
[place] EMERGENCY DEPARTMENT Provider Note   CSN: [id number] Arrival date & time: 12/31/19  1645     History Chief Complaint  Patient presents with  . Knee Pain    Deanna Vasquez is a 34 y.o. female.  Pt presents to the ED today with right knee pain.  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  She has had problems with right knee pain for several months.  She has been to see ortho (Dr. Laurance Flatten).  She had a MRI in May of 2020:   IMPRESSION: 1. No meniscal or ligamentous injury of the right knee. 2. Mild hyperintensity in the proximal lateral gastrocnemius muscle with a small amount of perifascial fluid as can be seen with mild muscle strain versus mild myositis.  Pt was put on Mobic.  She has not taken this because she does not like to take medications.  Today, her knee "locked up" on her while she was doing her job.  She is able to move it now, but it is still sore.        History reviewed. No pertinent past medical history.  There are no problems to display for this patient.   Past Surgical History:  Procedure Laterality Date  . CESAREAN SECTION    . DILATION AND CURETTAGE OF UTERUS    . TUBAL LIGATION       OB History   No obstetric history on file.     Family History  Problem Relation Age of Onset  . Peptic Ulcer Disease Mother   . Bronchitis Father   . Asthma Brother     Social History   Tobacco Use  . Smoking status: Never Smoker  . Smokeless tobacco: Never Used  Substance Use Topics  . Alcohol use: Yes    Comment: occasional  . Drug use: No    Home Medications Prior to Admission medications   Medication Sig Start Date End Date Taking? Authorizing Provider  HYDROcodone-acetaminophen (NORCO) 5-325 MG per tablet Take 1-2 tablets by mouth every 6 (six) hours as needed (for pain). 06/23/15   Molpus, John, MD  predniSONE (STERAPRED UNI-PAK 21 TAB) 10 MG (21) TBPK tablet Take 4 tablets for 2 days, then take 2 tablets for 2 days,  then take 1 tablet for 2 days. 12/31/19   Isla Pence, MD    Allergies    Latex  Review of Systems   Review of Systems  Musculoskeletal:       Right knee pain  All other systems reviewed and are negative.   Physical Exam Updated Vital Signs BP 111/68   Pulse 87   Temp 98.5 F (36.9 C) (Oral)   Resp 20   Ht 5\' 6"  (1.676 m)   Wt 46.3 kg   LMP 12/01/2019   SpO2 99%   BMI 16.46 kg/m   Physical Exam Vitals and nursing note reviewed.  Constitutional:      Appearance: Normal appearance. She is underweight.  HENT:     Head: Normocephalic and atraumatic.     Right Ear: External ear normal.     Left Ear: External ear normal.     Nose: Nose normal.     Mouth/Throat:     Mouth: Mucous membranes are moist.     Pharynx: Oropharynx is clear.  Eyes:     Extraocular Movements: Extraocular movements intact.     Conjunctiva/sclera: Conjunctivae normal.     Pupils: Pupils are equal, round, and reactive to light.  Cardiovascular:  Rate and Rhythm: Normal rate and regular rhythm.     Pulses: Normal pulses.     Heart sounds: Normal heart sounds.  Pulmonary:     Effort: Pulmonary effort is normal.     Breath sounds: Normal breath sounds.  Abdominal:     General: Abdomen is flat. Bowel sounds are normal.     Palpations: Abdomen is soft.  Musculoskeletal:        General: Normal range of motion.     Cervical back: Normal range of motion and neck supple.       Legs:  Skin:    General: Skin is warm.     Capillary Refill: Capillary refill takes less than 2 seconds.  Neurological:     General: No focal deficit present.     Mental Status: She is alert and oriented to person, place, and time.  Psychiatric:        Mood and Affect: Mood normal.        Behavior: Behavior normal.        Thought Content: Thought content normal.        Judgment: Judgment normal.     ED Results / Procedures / Treatments   Labs (all labs ordered are listed, but only abnormal results are  displayed) Labs Reviewed - No data to display  EKG None  Radiology DG Knee Complete 4 Views Right  Result Date: 12/31/2019 CLINICAL DATA:  Pain, Baker's cyst EXAM: RIGHT KNEE - COMPLETE 4+ VIEW COMPARISON:  None. FINDINGS: No fracture or dislocation is seen. The joint spaces are preserved. The visualized soft tissues are unremarkable. No suprapatellar knee joint effusion. IMPRESSION: Negative. Electronically Signed   By: Charline Bills M.D.   On: 12/31/2019 17:51    Procedures Procedures (including critical care time)  Medications Ordered in ED Medications  predniSONE (DELTASONE) tablet 40 mg (40 mg Oral Given 12/31/19 1717)    ED Course  I have reviewed the triage vital signs and the nursing notes.  Pertinent labs & imaging results that were available during my care of the patient were reviewed by me and considered in my medical decision making (see chart for details).    MDM Rules/Calculators/A&P                      Pt was willing to take some prednisone for a few days, so she was given a modified taper.  She is encouraged to take her Mobic and do her knee exercises.  She is to f/u with Dr. Christell Constant.  Return if worse.  Final Clinical Impression(s) / ED Diagnoses Final diagnoses:  Chronic pain of right knee    Rx / DC Orders ED Discharge Orders         Ordered    predniSONE (STERAPRED UNI-PAK 21 TAB) 10 MG (21) TBPK tablet     12/31/19 1757           Jacalyn Lefevre, MD 12/31/19 1758

## 2021-05-28 IMAGING — CR DG KNEE COMPLETE 4+V*R*
4 series · 4 of 4 positions shown · non-contrast
Comparison: None.

CLINICAL DATA: Pain, Baker's cyst

EXAM:
RIGHT KNEE - COMPLETE 4+ VIEW

[t knee ap right]
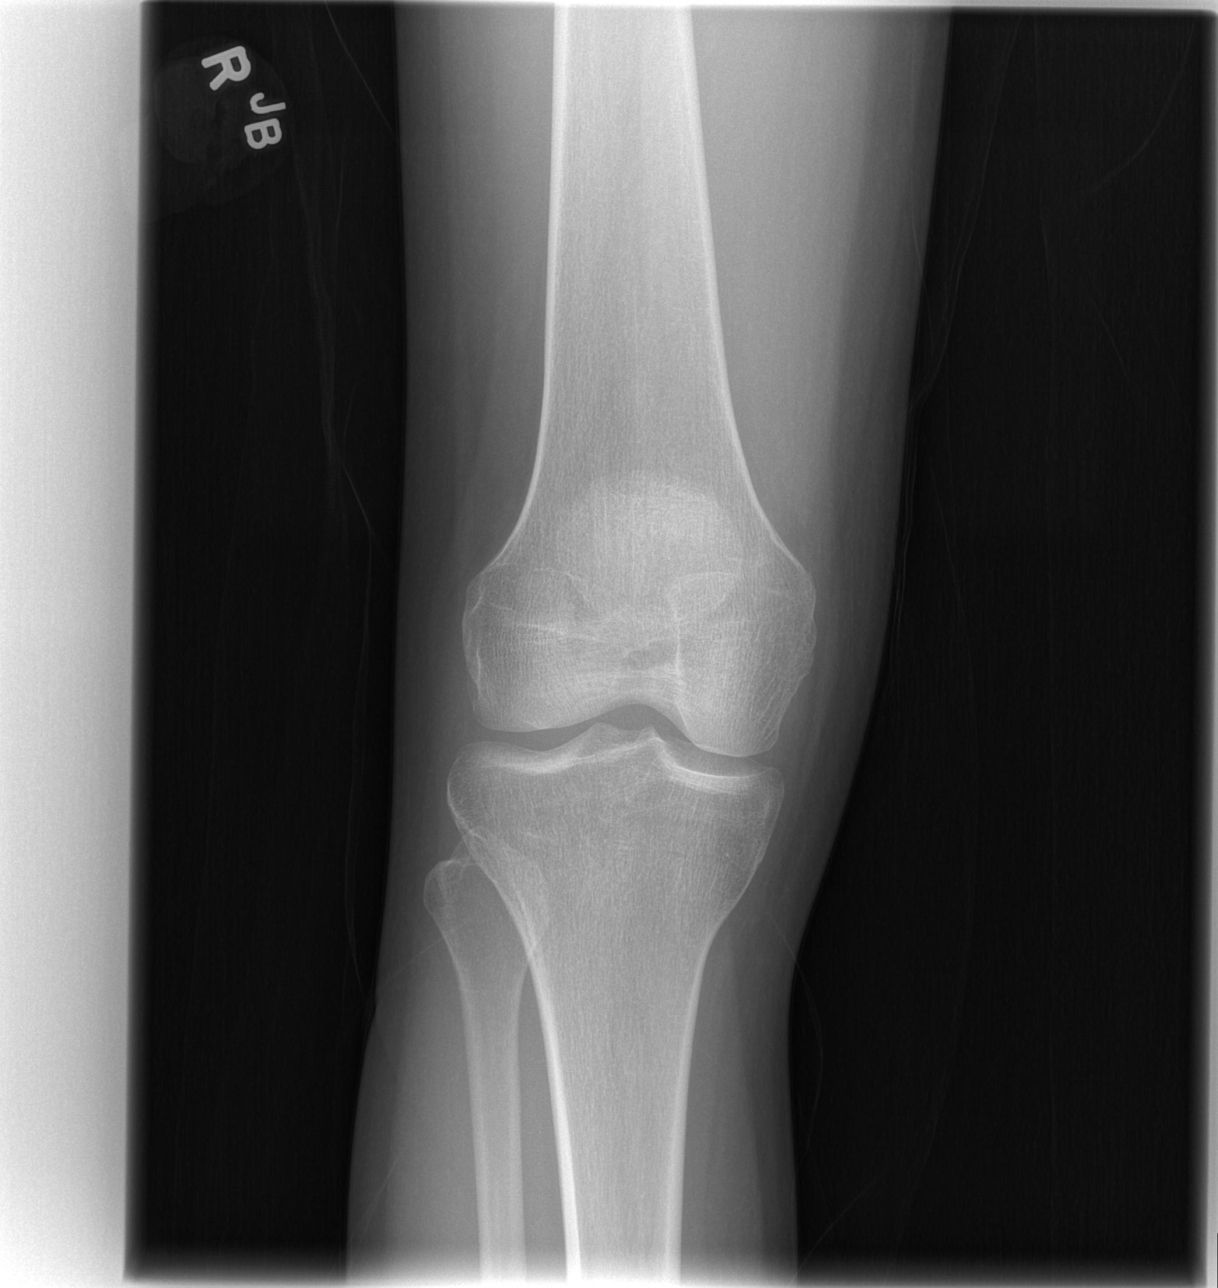

[t knee oblique right (1 of 2)]
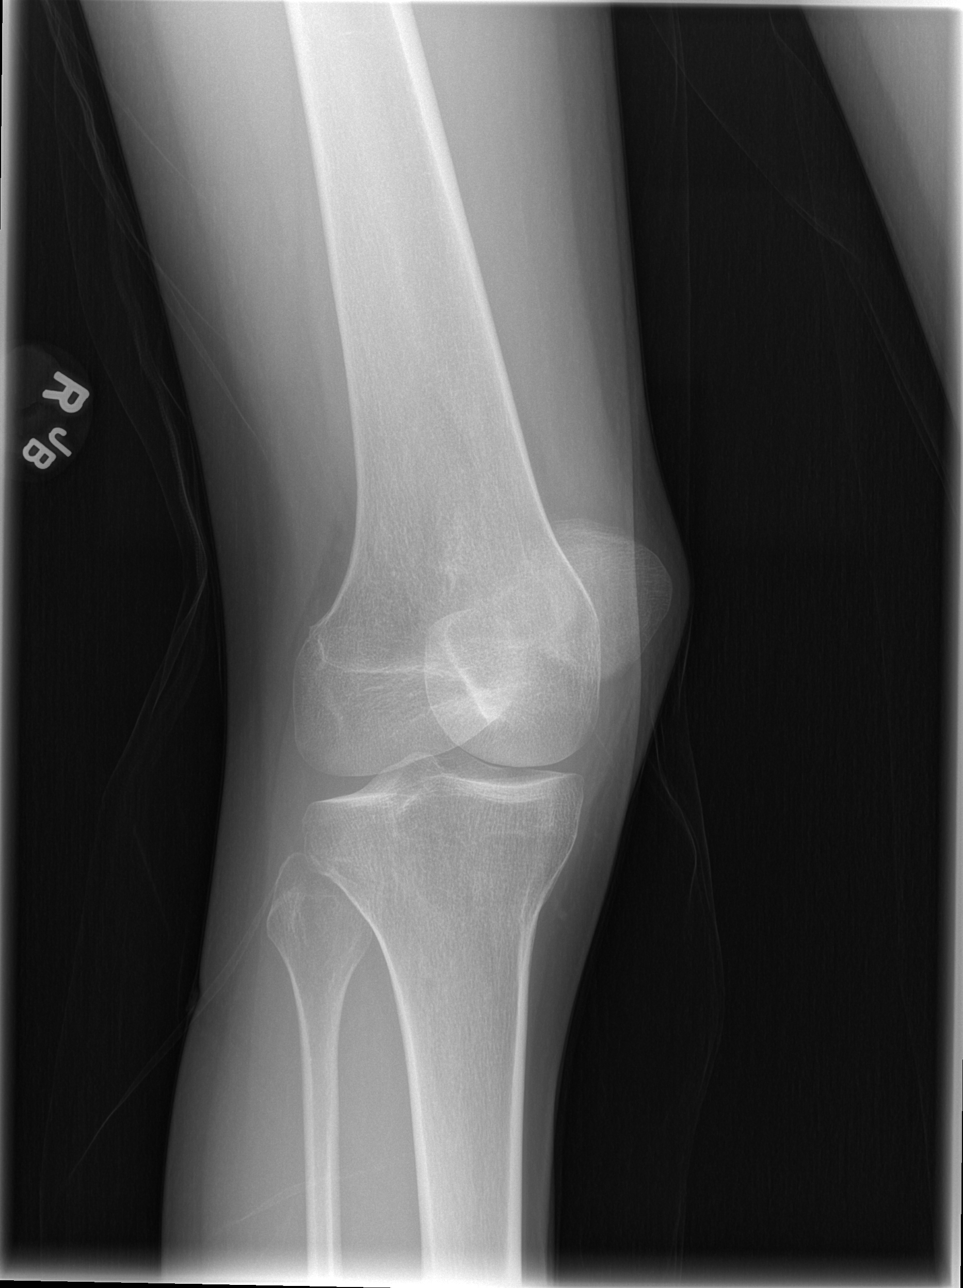

[t knee oblique right (2 of 2)]
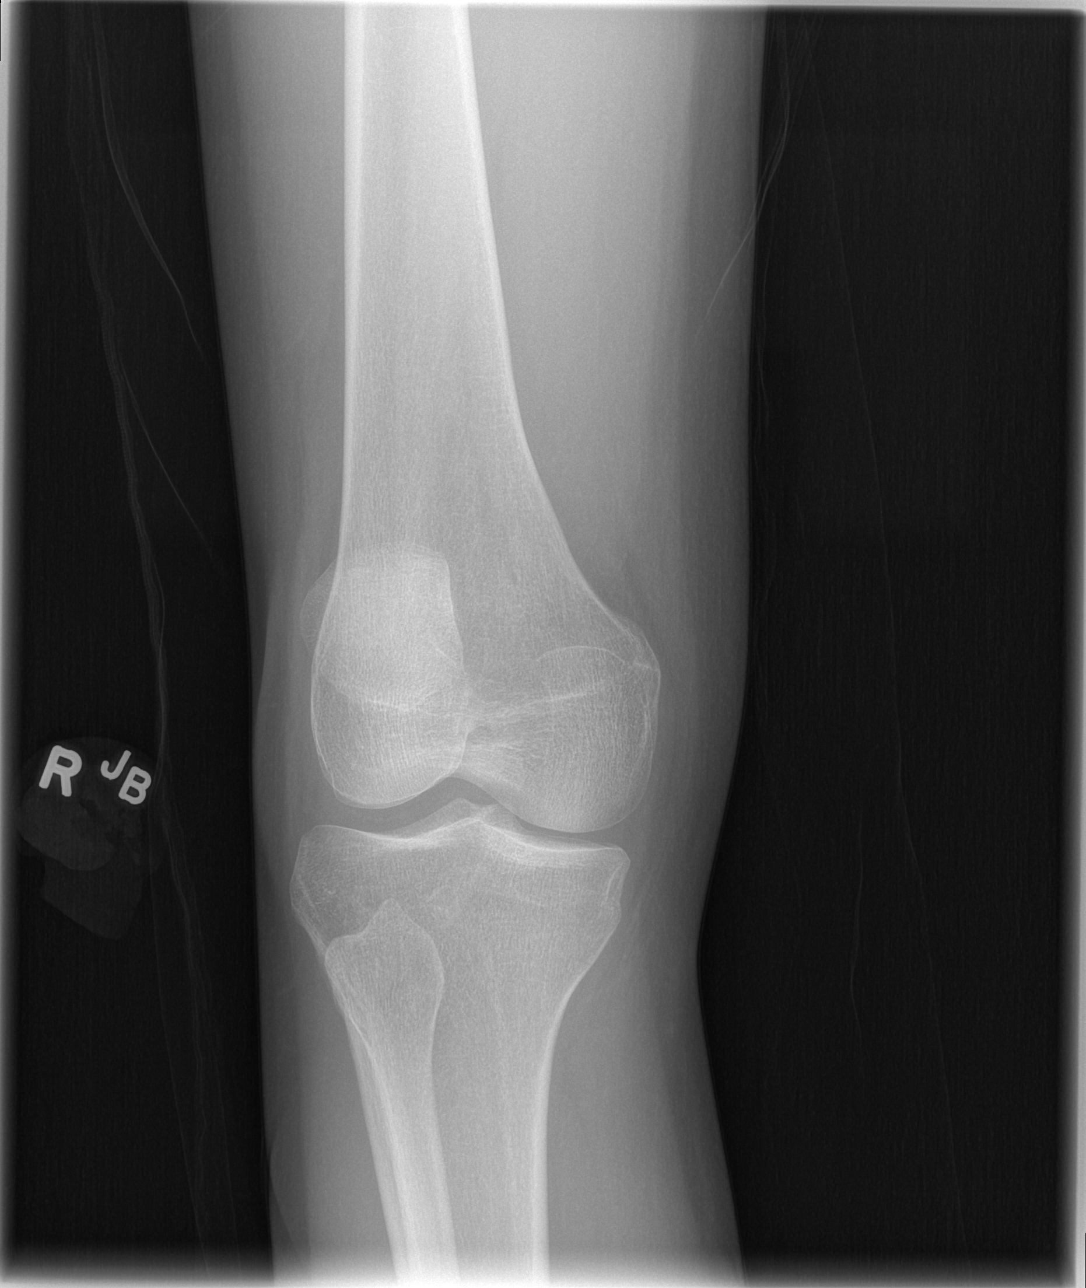

[t knee lat right]
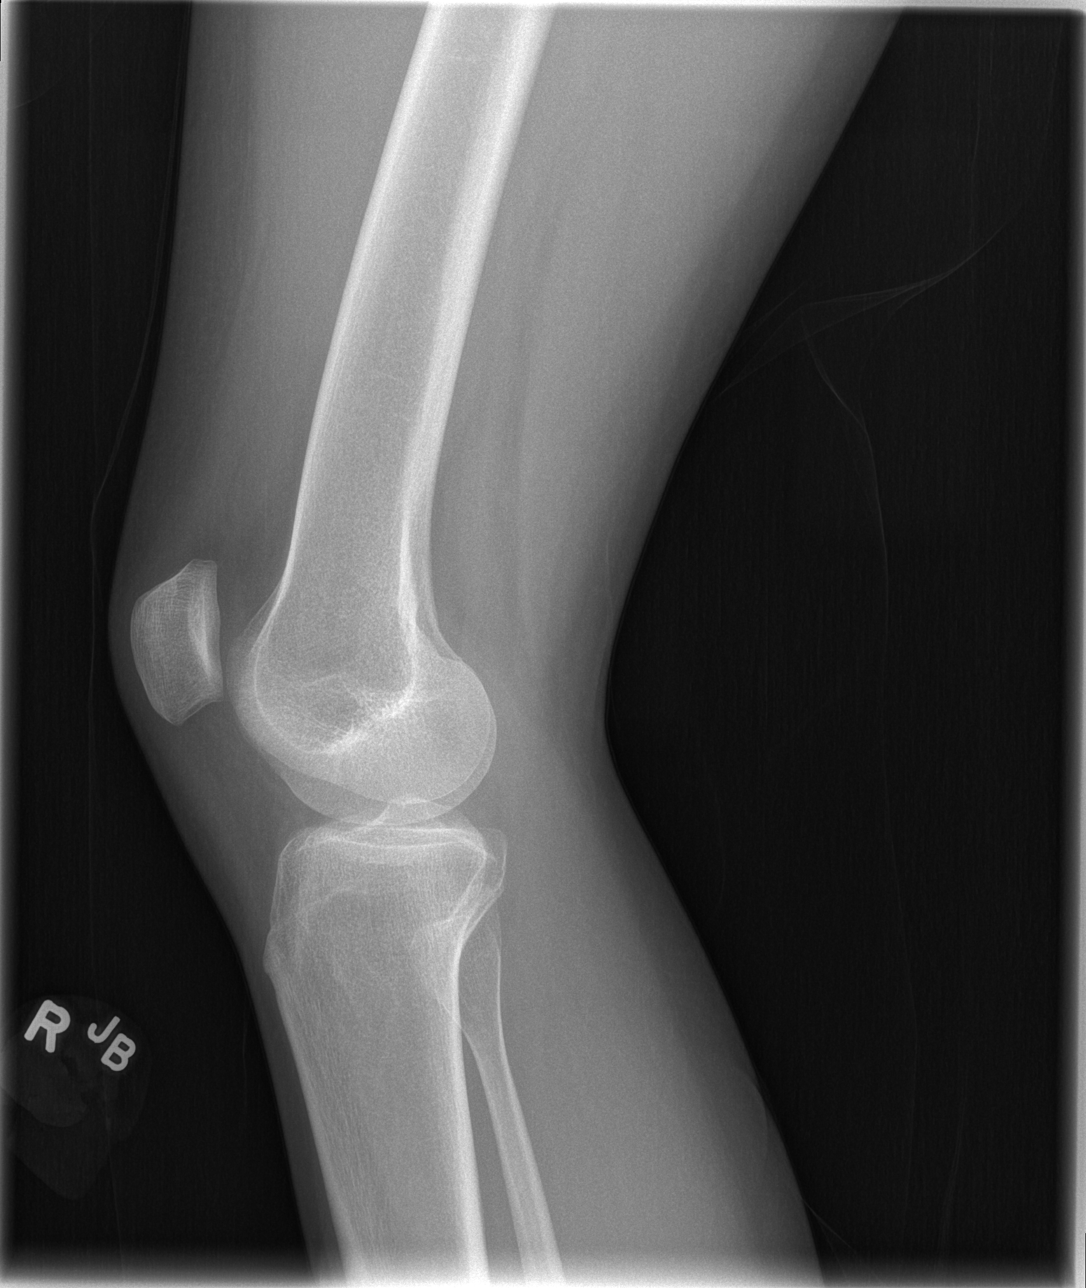

[4 of 4 positions shown; findings below may reference images not displayed]

FINDINGS: No fracture or dislocation is seen.

The joint spaces are preserved.

The visualized soft tissues are unremarkable.

No suprapatellar knee joint effusion.
IMPRESSION: Negative.

## 2022-03-12 ENCOUNTER — Encounter (HOSPITAL_BASED_OUTPATIENT_CLINIC_OR_DEPARTMENT_OTHER): Payer: Self-pay | Admitting: Urology

## 2022-03-12 ENCOUNTER — Emergency Department (HOSPITAL_BASED_OUTPATIENT_CLINIC_OR_DEPARTMENT_OTHER): Payer: Medicaid Other

## 2022-03-12 ENCOUNTER — Other Ambulatory Visit: Payer: Self-pay

## 2022-03-12 ENCOUNTER — Emergency Department (HOSPITAL_BASED_OUTPATIENT_CLINIC_OR_DEPARTMENT_OTHER)
Admission: EM | Admit: 2022-03-12 | Discharge: 2022-03-12 | Disposition: A | Discharge status: Home / Self Care | Payer: Medicaid Other | Attending: Emergency Medicine | Admitting: Emergency Medicine

## 2022-03-12 DIAGNOSIS — S92414A Nondisplaced fracture of proximal phalanx of right great toe, initial encounter for closed fracture: Secondary | ICD-10-CM | POA: Diagnosis not present

## 2022-03-12 DIAGNOSIS — W228XXA Striking against or struck by other objects, initial encounter: Secondary | ICD-10-CM | POA: Insufficient documentation

## 2022-03-12 DIAGNOSIS — S99921A Unspecified injury of right foot, initial encounter: Secondary | ICD-10-CM | POA: Diagnosis present

## 2022-03-12 NOTE — ED Provider Notes (Signed)
?[facility] EMERGENCY DEPARTMENT ?Provider Note ? ? ?CSN: [id number] ?Arrival date & time: 03/12/22  1655 ? ?  ? ?History ? ?Chief Complaint  ?Patient presents with  ? Toe Injury  ? ? ?Deanna Vasquez is a 36 y.o. female. ? ?HPI ?Patient presents with a right great toe injury.  States 2 days ago she accidentally hit her great toe on a small step going into her kitchen.  Has had pain since.  Worse with walking.  Is more at the base of the right great toe.  No other injury. ?  ? ?Home Medications ?Prior to Admission medications   ?Medication Sig Start Date End Date Taking? Authorizing Provider  ?HYDROcodone-acetaminophen (NORCO) 5-325 MG per tablet Take 1-2 tablets by mouth every 6 (six) hours as needed (for pain). 06/23/15   Molpus, John, MD  ?predniSONE (STERAPRED UNI-PAK 21 TAB) 10 MG (21) TBPK tablet Take 4 tablets for 2 days, then take 2 tablets for 2 days, then take 1 tablet for 2 days. 12/31/19   Isla Pence, MD  ?   ? ?Allergies    ?Latex   ? ?Review of Systems   ?Review of Systems  ?Musculoskeletal:   ?     Right great toe pain.  ? ?Physical Exam ?Updated Vital Signs ?BP 120/87   Pulse 80   Temp 98.2 ?F (36.8 ?C) (Oral)   Resp 18   Ht 5\' 6"  (1.676 m)   Wt 46.3 kg   SpO2 100%   BMI 16.48 kg/m?  ?Physical Exam ?Vitals and nursing note reviewed.  ?Cardiovascular:  ?   Rate and Rhythm: Regular rhythm.  ?Musculoskeletal:  ?   Comments: No tenderness over IP joint on right toe.  However is tender over first MTP joint on the right side.  No tenderness otherwise on the foot.  No real ecchymosis.  Sensation intact distally.  ?Neurological:  ?   Mental Status: She is alert.  ? ? ?ED Results / Procedures / Treatments   ?Labs ?(all labs ordered are listed, but only abnormal results are displayed) ?Labs Reviewed - No data to display ? ?EKG ?None ? ?Radiology ?DG Toe Great Right ? ?Result Date: 03/12/2022 ?CLINICAL DATA:  Stubbed toe EXAM: RIGHT GREAT TOE COMPARISON:  None Available. FINDINGS: Acute  nondisplaced intra-articular fracture medial base of the first proximal phalanx. No subluxation. Positive for soft tissue swelling IMPRESSION: Acute nondisplaced intra-articular fracture involving the base of the first proximal phalanx Electronically Signed   By: Donavan Foil M.D.   On: 03/12/2022 17:32   ? ?Procedures ?Procedures  ? ? ?Medications Ordered in ED ?Medications - No data to display ? ?ED Course/ Medical Decision Making/ A&P ?  ?                        ?Medical Decision Making ?Amount and/or Complexity of Data Reviewed ?Radiology: ordered. ? ? ?Patient with right great toe injury.  Tenderness at MTP joint.  X-ray independently interpreted and showed a small fracture.  No other apparent injury.  Will immobilize with buddy taping and postop shoe.  Sports medicine follow-up.  Does not appear require admission to the hospital. ? ? ? ? ? ? ? ?Final Clinical Impression(s) / ED Diagnoses ?Final diagnoses:  ?Closed nondisplaced fracture of proximal phalanx of right great toe, initial encounter  ? ? ?Rx / DC Orders ?ED Discharge Orders   ? ? None  ? ?  ? ? ?  ?Davonna Belling,  MD ?03/12/22 2327 ? ?

## 2022-03-12 NOTE — ED Triage Notes (Signed)
Stubbed right great toe Sunday on kitchen floor  ?Pain and swelling  ?Pain worse with walking ?

## 2022-03-12 NOTE — ED Notes (Signed)
Dc instructions reviewed with pt. Pt decline wheelchair and ambulated out of ED with steady gait. Pt will follow up next week.  ?

## 2022-03-22 ENCOUNTER — Encounter: Payer: Self-pay | Admitting: Family Medicine

## 2022-03-22 ENCOUNTER — Ambulatory Visit (INDEPENDENT_AMBULATORY_CARE_PROVIDER_SITE_OTHER): Payer: Medicaid Other | Admitting: Family Medicine

## 2022-03-22 VITALS — BP 102/70 | Ht 66.0 in | Wt 102.0 lb

## 2022-03-22 DIAGNOSIS — S92414A Nondisplaced fracture of proximal phalanx of right great toe, initial encounter for closed fracture: Secondary | ICD-10-CM | POA: Diagnosis present

## 2022-03-22 NOTE — Assessment & Plan Note (Signed)
Acutely occurring.  Having tenderness over the tibial aspect of the distal phalanx of the great toe.  No significant swelling today. ?-Counseled on home exercise therapy and supportive care. ?-Counseled on postop shoe. ?-Follow-up in 4 weeks. ?

## 2022-03-22 NOTE — Progress Notes (Signed)
?  Deanna Vasquez - 36 y.o. female MRN [id number]  Date of birth: January 22, 1986 ? ?SUBJECTIVE:  Including CC & ROS.  ?No chief complaint on file. ? ? ?Deanna Vasquez is a 36 y.o. female that is presenting with acute great toe pain.  She hit her toe on a step up.  She was found to have a fracture and was placed in a postop shoe.  Having minimal pain today. ? ? ?Review of Systems ?See HPI  ? ?HISTORY: Past Medical, Surgical, Social, and Family History Reviewed & Updated per EMR.   ?Pertinent Historical Findings include: ? ?History reviewed. No pertinent past medical history. ? ?Past Surgical History:  ?Procedure Laterality Date  ? CESAREAN SECTION    ? DILATION AND CURETTAGE OF UTERUS    ? TUBAL LIGATION    ? ? ? ?PHYSICAL EXAM:  ?VS: BP 102/70 (BP Location: Left Arm, Patient Position: Sitting)   Ht 5\' 6"  (1.676 m)   Wt 102 lb (46.3 kg)   BMI 16.46 kg/m?  ?Physical Exam ?Gen: NAD, alert, cooperative with exam, well-appearing ?MSK:  ?Neurovascularly intact   ? ? ? ? ?ASSESSMENT & PLAN:  ? ?Closed nondisplaced fracture of proximal phalanx of right great toe ?Acutely occurring.  Having tenderness over the tibial aspect of the distal phalanx of the great toe.  No significant swelling today. ?-Counseled on home exercise therapy and supportive care. ?-Counseled on postop shoe. ?-Follow-up in 4 weeks. ? ? ? ? ?

## 2022-03-22 NOTE — Patient Instructions (Signed)
Nice to meet you ?Please use ice as needed. ?Please use Tylenol ibuprofen as needed. ?Please continue with the postop shoe.  You can stop this after 3 weeks if pain is improved. ?Please continue to buddy tape ?Please send me a message in MyChart with any questions or updates.  ?Please see me back in 4 weeks.  ? ?--Dr. Jordan Likes ? ?

## 2022-04-19 ENCOUNTER — Encounter: Payer: Self-pay | Admitting: Family Medicine

## 2022-04-19 ENCOUNTER — Ambulatory Visit (INDEPENDENT_AMBULATORY_CARE_PROVIDER_SITE_OTHER): Payer: Medicaid Other | Admitting: Family Medicine

## 2022-04-19 DIAGNOSIS — S92414D Nondisplaced fracture of proximal phalanx of right great toe, subsequent encounter for fracture with routine healing: Secondary | ICD-10-CM | POA: Diagnosis not present

## 2022-04-19 NOTE — Assessment & Plan Note (Signed)
Acutely occurring.  Initial injury on 5/2.  Has normal range of motion and no significant pain to palpation today. -Counseled on home exercise therapy and supportive care. - Counseled on buddy taping. -Could consider imaging.

## 2022-04-19 NOTE — Progress Notes (Signed)
  Deanna Vasquez - 36 y.o. female MRN [id number]  Date of birth: 1986-03-17  SUBJECTIVE:  Including CC & ROS.  No chief complaint on file.   Deanna Vasquez is a 36 y.o. female that is following up for her great toe fracture.  She has been having some pain more at the MTP joint but nothing at the proximal phalanx.    Review of Systems See HPI   HISTORY: Past Medical, Surgical, Social, and Family History Reviewed & Updated per EMR.   Pertinent Historical Findings include:  History reviewed. No pertinent past medical history.  Past Surgical History:  Procedure Laterality Date   CESAREAN SECTION     DILATION AND CURETTAGE OF UTERUS     TUBAL LIGATION       PHYSICAL EXAM:  VS: BP 100/70 (BP Location: Left Arm, Patient Position: Sitting)   Ht 5\' 6"  (1.676 m)   Wt 102 lb (46.3 kg)   BMI 16.46 kg/m  Physical Exam Gen: NAD, alert, cooperative with exam, well-appearing MSK:  Neurovascularly intact       ASSESSMENT & PLAN:   Closed nondisplaced fracture of proximal phalanx of right great toe Acutely occurring.  Initial injury on 5/2.  Has normal range of motion and no significant pain to palpation today. -Counseled on home exercise therapy and supportive care. - Counseled on buddy taping. -Could consider imaging.

## 2023-02-27 ENCOUNTER — Encounter: Payer: Self-pay | Admitting: *Deleted
